# Patient Record
Sex: Female | Born: 1935 | Race: White | Hispanic: No | Marital: Married | Smoking: Former smoker
Health system: Southern US, Community
[De-identification: ages and names within clinical notes are randomized; demographics above are authoritative.]

## PROBLEM LIST (undated history)

## (undated) DIAGNOSIS — E119 Type 2 diabetes mellitus without complications: Secondary | ICD-10-CM

## (undated) DIAGNOSIS — E785 Hyperlipidemia, unspecified: Secondary | ICD-10-CM

## (undated) DIAGNOSIS — D649 Anemia, unspecified: Secondary | ICD-10-CM

## (undated) DIAGNOSIS — K5792 Diverticulitis of intestine, part unspecified, without perforation or abscess without bleeding: Secondary | ICD-10-CM

## (undated) HISTORY — PX: APPENDECTOMY: SHX54

## (undated) HISTORY — DX: Anemia, unspecified: D64.9

## (undated) HISTORY — DX: Hyperlipidemia, unspecified: E78.5

## (undated) HISTORY — DX: Diverticulitis of intestine, part unspecified, without perforation or abscess without bleeding: K57.92

## (undated) HISTORY — DX: Type 2 diabetes mellitus without complications: E11.9

## (undated) HISTORY — PX: BREAST EXCISIONAL BIOPSY: SUR124

---

## 1972-05-16 HISTORY — PX: TUBAL LIGATION: SHX77

## 2010-05-16 HISTORY — PX: BREAST SURGERY: SHX581

## 2010-05-16 HISTORY — PX: MEDIAL PARTIAL KNEE REPLACEMENT: SHX5965

## 2010-05-16 HISTORY — PX: ESOPHAGOGASTRODUODENOSCOPY: SHX1529

## 2011-11-09 ENCOUNTER — Emergency Department: Payer: Self-pay

## 2011-11-09 LAB — URINALYSIS, COMPLETE
Bacteria: NONE SEEN
Bilirubin,UR: NEGATIVE
Blood: NEGATIVE
Ketone: NEGATIVE
Ph: 7 (ref 4.5–8.0)
Protein: NEGATIVE
Specific Gravity: 1.016 (ref 1.003–1.030)
Squamous Epithelial: <1
WBC UR: 1 /HPF (ref 0–5)

## 2011-11-09 LAB — COMPREHENSIVE METABOLIC PANEL
Albumin: 3.9 g/dL (ref 3.4–5.0)
Anion Gap: 9 (ref 7–16)
BUN: 14 mg/dL (ref 7–18)
Bilirubin,Total: 0.4 mg/dL (ref 0.2–1.0)
Chloride: 107 mmol/L (ref 98–107)
Co2: 24 mmol/L (ref 21–32)
Creatinine: 0.93 mg/dL (ref 0.60–1.30)
EGFR (African American): >60
Osmolality: 281 (ref 275–301)
Potassium: 4.2 mmol/L (ref 3.5–5.1)
SGOT(AST): 23 U/L (ref 15–37)
SGPT (ALT): 23 U/L
Sodium: 140 mmol/L (ref 136–145)
Total Protein: 7.5 g/dL (ref 6.4–8.2)

## 2011-11-09 LAB — CBC
HCT: 40.5 % (ref 35.0–47.0)
MCHC: 32.9 g/dL (ref 32.0–36.0)
Platelet: 335 10*3/uL (ref 150–440)
RDW: 14.5 % (ref 11.5–14.5)

## 2012-02-02 ENCOUNTER — Ambulatory Visit: Payer: Self-pay | Admitting: Unknown Physician Specialty

## 2012-11-08 ENCOUNTER — Ambulatory Visit: Payer: Self-pay | Admitting: Gastroenterology

## 2012-12-03 ENCOUNTER — Emergency Department: Payer: Self-pay | Admitting: Emergency Medicine

## 2012-12-03 LAB — CBC
MCH: 29 pg (ref 26.0–34.0)
MCV: 86 fL (ref 80–100)
RBC: 4.55 10*6/uL (ref 3.80–5.20)
RDW: 14.6 % — ABNORMAL HIGH (ref 11.5–14.5)

## 2012-12-03 LAB — BASIC METABOLIC PANEL
BUN: 13 mg/dL (ref 7–18)
Chloride: 106 mmol/L (ref 98–107)
Co2: 29 mmol/L (ref 21–32)
Creatinine: 0.79 mg/dL (ref 0.60–1.30)
EGFR (African American): >60
Glucose: 125 mg/dL — ABNORMAL HIGH (ref 65–99)
Osmolality: 275 (ref 275–301)
Potassium: 4.5 mmol/L (ref 3.5–5.1)
Sodium: 137 mmol/L (ref 136–145)

## 2013-12-17 ENCOUNTER — Ambulatory Visit: Payer: Self-pay | Admitting: Family Medicine

## 2014-02-11 LAB — CBC AND DIFFERENTIAL
HCT: 38 % (ref 36–46)
Hemoglobin: 6.1 g/dL — AB (ref 12.0–16.0)
NEUTROS ABS: 4 /uL
Platelets: 395 10*3/uL (ref 150–399)
WBC: 7.3 10^3/mL

## 2014-02-11 LAB — BASIC METABOLIC PANEL
Glucose: 154 mg/dL
Sodium: 141 mmol/L (ref 137–147)

## 2014-02-11 LAB — LIPID PANEL
Cholesterol: 174 mg/dL (ref 0–200)
HDL: 57 mg/dL (ref 35–70)
LDL Cholesterol: 78 mg/dL
TRIGLYCERIDES: 195 mg/dL — AB (ref 40–160)

## 2014-11-12 ENCOUNTER — Encounter: Payer: Self-pay | Admitting: Family Medicine

## 2014-11-12 ENCOUNTER — Ambulatory Visit (INDEPENDENT_AMBULATORY_CARE_PROVIDER_SITE_OTHER): Payer: Medicare Other | Admitting: Family Medicine

## 2014-11-12 VITALS — BP 112/71 | HR 64 | Temp 98.0°F | Resp 16 | Ht 62.0 in | Wt 152.4 lb

## 2014-11-12 DIAGNOSIS — D649 Anemia, unspecified: Secondary | ICD-10-CM | POA: Insufficient documentation

## 2014-11-12 DIAGNOSIS — E785 Hyperlipidemia, unspecified: Secondary | ICD-10-CM | POA: Insufficient documentation

## 2014-11-12 DIAGNOSIS — R928 Other abnormal and inconclusive findings on diagnostic imaging of breast: Secondary | ICD-10-CM

## 2014-11-12 DIAGNOSIS — D509 Iron deficiency anemia, unspecified: Secondary | ICD-10-CM | POA: Diagnosis not present

## 2014-11-12 DIAGNOSIS — E119 Type 2 diabetes mellitus without complications: Secondary | ICD-10-CM | POA: Diagnosis not present

## 2014-11-12 DIAGNOSIS — K5792 Diverticulitis of intestine, part unspecified, without perforation or abscess without bleeding: Secondary | ICD-10-CM | POA: Insufficient documentation

## 2014-11-12 NOTE — Patient Instructions (Signed)
Health Maintenance  Topic Date Due  . FOOT EXAM  01/12/2015 (Originally 10/23/2014)  . HEMOGLOBIN A1C  01/12/2015 (Originally 07/24/2014)  . URINE MICROALBUMIN  01/12/2015 (Originally 06/10/1941)  . OPHTHALMOLOGY EXAM  05/14/2015 (Originally 07/15/2014)  . INFLUENZA VACCINE  12/15/2014  . COLONOSCOPY  10/15/2022  . TETANUS/TDAP  12/15/2023  . DEXA SCAN  Completed  . ZOSTAVAX  Completed  . PNA vac Low Risk Adult  Completed

## 2014-11-12 NOTE — Progress Notes (Signed)
Patient: Ashley Kramer, Female    DOB: 06/06/1931, 81 y.o.   MRN: [id number] Visit Date: 11/12/2014  Today's Provider: Fidel LevyJames Hawkins Jr, MD  Chief Complaint  Patient presents with  . Medicare Wellness    Subjective:   Initial preventative physical exam Ashley Kramer is a 81 y.o. female who presents today for her Initial Preventative Physical Exam. She feels well. She reports exercising . She reports she is sleeping well.  HPI  Review of Systems  History   Social History  . Marital Status: Married    Spouse Name: N/A  . Number of Children: N/A  . Years of Education: N/A   Occupational History  . Not on file.   Social History Main Topics  . Smoking status: Former Smoker    Types: Cigarettes    Quit date: 11/11/1997  . Smokeless tobacco: Never Used  . Alcohol Use: No  . Drug Use: No  . Sexual Activity: Not on file   Other Topics Concern  . Not on file   Social History Narrative  . No narrative on file    Patient Active Problem List   Diagnosis Date Noted  . Diabetes 11/12/2014  . Hyperlipemia 11/12/2014  . Abnormal mammogram 11/12/2014  . Anemia 11/12/2014  . Diverticulitis 11/12/2014    Past Surgical History  Procedure Laterality Date  . Esophagogastroduodenoscopy  2012  . Appendectomy    . Breast surgery  2012    BIOPSY   . Tubal ligation  1974  . Medial partial knee replacement Right 2012    Her family history includes Cancer in her mother; Diabetes in her father; Stroke in her mother.    Previous Medications   ATORVASTATIN (LIPITOR) 40 MG TABLET    Take 40 mg by mouth daily.   CARVEDILOL (COREG) 6.25 MG TABLET       CHOLECALCIFEROL (VITAMIN D-3 PO)    Take by mouth.   CLOPIDOGREL (PLAVIX) 75 MG TABLET    Take 75 mg by mouth daily.   METFORMIN (GLUCOPHAGE) 500 MG TABLET    Take 500 mg by mouth 2 (two) times daily with a meal.   MULTIPLE VITAMIN (MULTIVITAMIN) CAPSULE    Take 1 capsule by mouth daily.   OMEGA-3 FATTY ACIDS (FISH OIL)  1000 MG CPDR    Take by mouth.   SERTRALINE (ZOLOFT) 50 MG TABLET    Take 50 mg by mouth daily.    Patient Care Team: Janeann ForehandJames H Hawkins Jr., MD as PCP - General (Family Medicine)     Objective:   Vitals: BP 112/71 mmHg  Pulse 64  Temp(Src) 98 F (36.7 C)  Resp 16  Ht 5\' 2"  (1.575 m)  Wt 152 lb 6.4 oz (69.128 kg)  BMI 27.87 kg/m2  Physical Exam    Hearing Screening   Method: Otoacoustic emissions   125Hz  250Hz  500Hz  1000Hz  2000Hz  4000Hz  8000Hz   Right ear:         Left ear:         Comments: Finger Rub Pass R and L ears.    Visual Acuity Screening   Right eye Left eye Both eyes  Without correction: 20/13 20/50 20/15   With correction:       Activities of Daily Living In your present state of health, do you have any difficulty performing the following activities: 11/12/2014  Hearing? N  Vision? N  Difficulty concentrating or making decisions? N  Walking or climbing stairs? N  Dressing or bathing? N  Doing errands,  shopping? N  Preparing Food and eating ? N  Using the Toilet? N  In the past six months, have you accidently leaked urine? N  Do you have problems with loss of bowel control? N  Managing your Medications? N  Managing your Finances? N  Housekeeping or managing your Housekeeping? N    Fall Risk Assessment Fall Risk  11/12/2014 11/12/2014  Falls in the past year? Yes No  Number falls in past yr: 1 -  Injury with Fall? No -     Patient reports there are not safety devices in place in shower at home.   Depression Screen PHQ 2/9 Scores 11/12/2014 11/12/2014  PHQ - 2 Score 0 0    MMSE MMSE - Mini Mental State Exam 11/12/2014  Orientation to time 5  Orientation to Place 5  Registration 3  Attention/ Calculation 5  Recall 3  Language- name 2 objects 2  Language- repeat 1  Language- follow 3 step command 3  Language- read & follow direction 1  Write a sentence 1  Copy design 1  Total score 30      Assessment & Plan:     Initial Preventative  Physical Exam  Reviewed patient's Family Medical History Reviewed and updated list of patient's medical providers Assessment of cognitive impairment was done Assessed patient's functional ability Established a written schedule for health screening services Health Risk Assessent Completed and Reviewed  Exercise Activities and Dietary recommendations Goals    . Eat more fruits and vegetables    . Exercise 3x per week (30 min per time)     Increase excersizing       Immunization History  Administered Date(s) Administered  . Influenza-Unspecified 02/13/2014  . Pneumococcal Conjugate-13 02/13/2014  . Pneumococcal Polysaccharide-23 05/16/2012  . Tdap 10/22/2013    Health Maintenance  Topic Date Due  . FOOT EXAM  01/12/2015 (Originally 10/23/2014)  . HEMOGLOBIN A1C  01/12/2015 (Originally 07/24/2014)  . URINE MICROALBUMIN  01/12/2015 (Originally 06/10/1941)  . OPHTHALMOLOGY EXAM  05/14/2015 (Originally 07/15/2014)  . INFLUENZA VACCINE  12/15/2014  . COLONOSCOPY  10/15/2022  . TETANUS/TDAP  12/15/2023  . DEXA SCAN  Completed  . ZOSTAVAX  Completed  . PNA vac Low Risk Adult  Completed      Discussed health benefits of physical activity, and encouraged her to engage in regular exercise appropriate for her age and condition.    ------------------------------------------------------------------------------------------------------------   Problem List Items Addressed This Visit      Endocrine   Diabetes - Primary   Relevant Medications   atorvastatin (LIPITOR) 40 MG tablet   metFORMIN (GLUCOPHAGE) 500 MG tablet     Other   Hyperlipemia   Relevant Medications   atorvastatin (LIPITOR) 40 MG tablet   carvedilol (COREG) 6.25 MG tablet   Abnormal mammogram   Anemia       Venora Maples, MD [facility]  11/12/2014

## 2014-12-01 ENCOUNTER — Telehealth: Payer: Self-pay | Admitting: Family Medicine

## 2014-12-01 NOTE — Telephone Encounter (Signed)
[facility] [address]

## 2014-12-02 ENCOUNTER — Other Ambulatory Visit: Payer: Self-pay

## 2014-12-02 ENCOUNTER — Other Ambulatory Visit: Payer: Self-pay | Admitting: Family Medicine

## 2014-12-02 DIAGNOSIS — T7840XA Allergy, unspecified, initial encounter: Secondary | ICD-10-CM

## 2014-12-02 MED ORDER — EPINEPHRINE 0.3 MG/0.3ML IJ SOAJ: 0.3000 mg | Freq: Once | INTRAMUSCULAR | Status: DC | PRN

## 2014-12-02 NOTE — Telephone Encounter (Signed)
It was send to her pharmacy.

## 2014-12-03 ENCOUNTER — Encounter: Payer: Self-pay | Admitting: Family Medicine

## 2014-12-03 ENCOUNTER — Ambulatory Visit (INDEPENDENT_AMBULATORY_CARE_PROVIDER_SITE_OTHER): Payer: Medicare Other | Admitting: Family Medicine

## 2014-12-03 VITALS — BP 114/71 | HR 64 | Resp 16 | Ht 62.0 in | Wt 152.4 lb

## 2014-12-03 DIAGNOSIS — I10 Essential (primary) hypertension: Secondary | ICD-10-CM

## 2014-12-03 DIAGNOSIS — F419 Anxiety disorder, unspecified: Secondary | ICD-10-CM | POA: Diagnosis not present

## 2014-12-03 DIAGNOSIS — E119 Type 2 diabetes mellitus without complications: Secondary | ICD-10-CM | POA: Diagnosis not present

## 2014-12-03 DIAGNOSIS — G459 Transient cerebral ischemic attack, unspecified: Secondary | ICD-10-CM | POA: Insufficient documentation

## 2014-12-03 DIAGNOSIS — E785 Hyperlipidemia, unspecified: Secondary | ICD-10-CM

## 2014-12-03 LAB — POCT GLYCOSYLATED HEMOGLOBIN (HGB A1C): Hemoglobin A1C: 7.4

## 2014-12-03 MED ORDER — ATORVASTATIN CALCIUM 40 MG PO TABS: 40.0000 mg | ORAL_TABLET | Freq: Every day | ORAL | Status: DC

## 2014-12-03 MED ORDER — CARVEDILOL 6.25 MG PO TABS: 6.2500 mg | ORAL_TABLET | Freq: Two times a day (BID) | ORAL | Status: DC

## 2014-12-03 MED ORDER — METFORMIN HCL 500 MG PO TABS: 500.0000 mg | ORAL_TABLET | Freq: Two times a day (BID) | ORAL | Status: DC

## 2014-12-03 MED ORDER — SERTRALINE HCL 50 MG PO TABS: 50.0000 mg | ORAL_TABLET | Freq: Every day | ORAL | Status: DC

## 2014-12-03 MED ORDER — CLOPIDOGREL BISULFATE 75 MG PO TABS: 75.0000 mg | ORAL_TABLET | Freq: Every day | ORAL | Status: DC

## 2014-12-03 NOTE — Progress Notes (Signed)
Name: Ashley Kramer   MRN: [id number]    DOB: 10-11-1931   Date:12/03/2014       Progress Note  Subjective  Chief Complaint  Chief Complaint  Patient presents with  . Diabetes    99-130 BS monitoring three times a week.     HPI  Here for f/u of DM and HBP.  Also with elevated lipids.  Had TIA in past. She is taking meds and feels well.   Past Medical History  Diagnosis Date  . Diverticulitis   . Hyperlipidemia   . Diabetes mellitus without complication   . Anemia     Past Surgical History  Procedure Laterality Date  . Esophagogastroduodenoscopy  2012  . Appendectomy    . Breast surgery  2012    BIOPSY   . Tubal ligation  1974  . Medial partial knee replacement Right 2012    Family History  Problem Relation Age of Onset  . Stroke Mother   . Cancer Mother   . Diabetes Father     History   Social History  . Marital Status: Married    Spouse Name: N/A  . Number of Children: N/A  . Years of Education: N/A   Occupational History  . Not on file.   Social History Main Topics  . Smoking status: Former Smoker    Types: Cigarettes    Quit date: 11/11/1997  . Smokeless tobacco: Never Used  . Alcohol Use: No  . Drug Use: No  . Sexual Activity: Not on file   Other Topics Concern  . Not on file   Social History Narrative     Current outpatient prescriptions:  .  atorvastatin (LIPITOR) 40 MG tablet, Take 40 mg by mouth daily., Disp: , Rfl: 0 .  carvedilol (COREG) 6.25 MG tablet, , Disp: , Rfl: 0 .  Cholecalciferol (VITAMIN D-3 PO), Take by mouth., Disp: , Rfl:  .  clopidogrel (PLAVIX) 75 MG tablet, Take 75 mg by mouth daily., Disp: , Rfl: 0 .  EPINEPHrine 0.3 mg/0.3 mL IJ SOAJ injection, Inject 0.3 mg into the muscle once. EPI PEN INJ, Disp: , Rfl:  .  EPINEPHrine 0.3 mg/0.3 mL IJ SOAJ injection, Inject 0.3 mLs (0.3 mg total) into the muscle Once PRN (Allergic Reaction USE ONLY)., Disp: 1 Device, Rfl: 1 .  metFORMIN (GLUCOPHAGE) 500 MG tablet, Take 500  mg by mouth 2 (two) times daily with a meal., Disp: , Rfl:  .  Multiple Vitamin (MULTIVITAMIN) capsule, Take 1 capsule by mouth daily., Disp: , Rfl:  .  Omega-3 Fatty Acids (FISH OIL) 1000 MG CPDR, Take by mouth., Disp: , Rfl:  .  sertraline (ZOLOFT) 50 MG tablet, Take 50 mg by mouth daily., Disp: , Rfl: 0  No Known Allergies   Review of Systems  Constitutional: Negative for fever, chills and malaise/fatigue.  HENT: Negative for congestion and nosebleeds.   Eyes: Negative for blurred vision and double vision.  Respiratory: Negative for cough, sputum production, shortness of breath and wheezing.   Cardiovascular: Negative for chest pain, palpitations, orthopnea, claudication and leg swelling.  Gastrointestinal: Negative for heartburn and blood in stool.  Genitourinary: Negative for dysuria, urgency and frequency.  Musculoskeletal: Negative for myalgias and joint pain.  Skin: Negative for rash.  Neurological: Negative for dizziness, tremors, sensory change, focal weakness, weakness and headaches.  Psychiatric/Behavioral: Negative for depression. The patient is nervous/anxious.      Objective  Filed Vitals:   12/03/14 1133  BP: 114/71  Pulse:  64  Resp: 16  Height: 5\' 2"  (1.575 m)  Weight: 152 lb 6.4 oz (69.128 kg)    Physical Exam  Constitutional: She is well-developed, well-nourished, and in no distress. No distress.  HENT:  Head: Normocephalic and atraumatic.  Eyes: Conjunctivae and EOM are normal. Pupils are equal, round, and reactive to light. No scleral icterus.  Neck: Normal range of motion. Neck supple. No thyromegaly present.  Cardiovascular: Normal rate, regular rhythm, normal heart sounds and intact distal pulses.  Exam reveals no gallop and no friction rub.   No murmur heard. Pulmonary/Chest: Effort normal and breath sounds normal. No respiratory distress. She has no wheezes. She has no rales.  Abdominal: Soft. Bowel sounds are normal. She exhibits no distension and  no mass. There is no tenderness.  Musculoskeletal: She exhibits no edema.  Lymphadenopathy:    She has no cervical adenopathy.  Psychiatric: Mood, memory, affect and judgment normal.  Vitals reviewed.      No results found for this or any previous visit (from the past 2160 hour(s)).   Assessment & Plan  Problem List Items Addressed This Visit      Cardiovascular and Mediastinum   Hypertension   TIA (transient ischemic attack)    Other Visit Diagnoses    Type 2 diabetes mellitus without complication    -  Primary    Relevant Orders    POCT HgB A1C      1. Type 2 diabetes mellitus without complication  - POCT HgB A1C - Comprehensive Metabolic Panel (CMET) - metFORMIN (GLUCOPHAGE) 500 MG tablet; Take 1 tablet (500 mg total) by mouth 2 (two) times daily with a meal.  Dispense: 180 tablet; Refill: 3  2. Essential hypertension  - carvedilol (COREG) 6.25 MG tablet; Take 1 tablet (6.25 mg total) by mouth 2 (two) times daily with a meal.  Dispense: 180 tablet; Refill: 3  3. Transient cerebral ischemia, unspecified transient cerebral ischemia type  - CBC with Differential - clopidogrel (PLAVIX) 75 MG tablet; Take 1 tablet (75 mg total) by mouth daily.  Dispense: 90 tablet; Refill: 3  4. Hyperlipemia  - Lipid Profile - atorvastatin (LIPITOR) 40 MG tablet; Take 1 tablet (40 mg total) by mouth daily.  Dispense: 90 tablet; Refill: 3  5.  - sertraline (ZOLOFT) 50 MG tablet; Take 1 tablet (50 mg total) by mouth daily.  Dispense: 90 tablet; Refill: 3

## 2014-12-04 LAB — CBC WITH DIFFERENTIAL/PLATELET
BASOS ABS: 0.1 10*3/uL (ref 0.0–0.2)
BASOS: 1 %
EOS (ABSOLUTE): 0.3 10*3/uL (ref 0.0–0.4)
Eos: 5 %
Hematocrit: 41.4 % (ref 34.0–46.6)
Hemoglobin: 13.5 g/dL (ref 11.1–15.9)
IMMATURE GRANS (ABS): 0 10*3/uL (ref 0.0–0.1)
IMMATURE GRANULOCYTES: 0 %
LYMPHS: 38 %
Lymphocytes Absolute: 2.7 10*3/uL (ref 0.7–3.1)
MCH: 28.9 pg (ref 26.6–33.0)
MCHC: 32.6 g/dL (ref 31.5–35.7)
MCV: 89 fL (ref 79–97)
Monocytes Absolute: 0.7 10*3/uL (ref 0.1–0.9)
Monocytes: 10 %
Neutrophils Absolute: 3.3 10*3/uL (ref 1.4–7.0)
Neutrophils: 46 %
Platelets: 351 10*3/uL (ref 150–379)
RBC: 4.67 x10E6/uL (ref 3.77–5.28)
RDW: 14.7 % (ref 12.3–15.4)
WBC: 7.1 10*3/uL (ref 3.4–10.8)

## 2014-12-04 LAB — LIPID PANEL
CHOLESTEROL TOTAL: 183 mg/dL (ref 100–199)
Chol/HDL Ratio: 3 ratio units (ref 0.0–4.4)
HDL: 62 mg/dL (ref >39)
LDL CALC: 92 mg/dL (ref 0–99)
Triglycerides: 144 mg/dL (ref 0–149)
VLDL CHOLESTEROL CAL: 29 mg/dL (ref 5–40)

## 2014-12-04 LAB — COMPREHENSIVE METABOLIC PANEL
ALK PHOS: 79 IU/L (ref 39–117)
ALT: 17 IU/L (ref 0–32)
AST: 18 IU/L (ref 0–40)
Albumin/Globulin Ratio: 2.2 (ref 1.1–2.5)
Albumin: 4.6 g/dL (ref 3.5–4.7)
BUN / CREAT RATIO: 17 (ref 11–26)
BUN: 12 mg/dL (ref 8–27)
Bilirubin Total: 0.4 mg/dL (ref 0.0–1.2)
CALCIUM: 10.1 mg/dL (ref 8.7–10.3)
CO2: 26 mmol/L (ref 18–29)
Chloride: 102 mmol/L (ref 97–108)
Creatinine, Ser: 0.72 mg/dL (ref 0.57–1.00)
GFR calc non Af Amer: 79 mL/min/{1.73_m2} (ref >59)
GFR, EST AFRICAN AMERICAN: 91 mL/min/{1.73_m2} (ref >59)
GLUCOSE: 115 mg/dL — AB (ref 65–99)
Globulin, Total: 2.1 g/dL (ref 1.5–4.5)
POTASSIUM: 5 mmol/L (ref 3.5–5.2)
Sodium: 142 mmol/L (ref 134–144)
Total Protein: 6.7 g/dL (ref 6.0–8.5)

## 2014-12-08 NOTE — Progress Notes (Signed)
Mailed.JH  

## 2015-01-15 ENCOUNTER — Telehealth: Payer: Self-pay | Admitting: *Deleted

## 2015-01-15 NOTE — Telephone Encounter (Signed)
Good-jh 

## 2015-01-15 NOTE — Telephone Encounter (Signed)
Called patient to find out if she has been keep blood sugar logs. Per [facility] this is one of the requirements. Pt has logs and will bring them with her to next appt on 01/20/15.

## 2015-01-20 ENCOUNTER — Ambulatory Visit (INDEPENDENT_AMBULATORY_CARE_PROVIDER_SITE_OTHER): Payer: Medicare Other | Admitting: Family Medicine

## 2015-01-20 ENCOUNTER — Encounter: Payer: Self-pay | Admitting: Family Medicine

## 2015-01-20 VITALS — BP 130/77 | HR 76 | Resp 16 | Ht 62.0 in | Wt 153.6 lb

## 2015-01-20 DIAGNOSIS — I10 Essential (primary) hypertension: Secondary | ICD-10-CM

## 2015-01-20 DIAGNOSIS — E785 Hyperlipidemia, unspecified: Secondary | ICD-10-CM

## 2015-01-20 DIAGNOSIS — Z23 Encounter for immunization: Secondary | ICD-10-CM | POA: Diagnosis not present

## 2015-01-20 DIAGNOSIS — E119 Type 2 diabetes mellitus without complications: Secondary | ICD-10-CM | POA: Diagnosis not present

## 2015-01-20 NOTE — Patient Instructions (Signed)
Continue current meds.  To get an A1c in [place] in about 6 weeks (after Oct. 22, 2016).  She will call us for an  Order to be faxed therr closer to time.

## 2015-01-20 NOTE — Progress Notes (Signed)
Name: Ashley Kramer   MRN: [id number]    DOB: 1932-01-21   Date:01/20/2015       Progress Note  Subjective  Chief Complaint  Chief Complaint  Patient presents with  . Diabetes    7.4 last A1C 12/05/2014.     HPI  Here for f/u of DM.  BSs fluctuating some.  (75-200 2ith most <170).  Taking higher dose of Metformin (2000mg /d) now.    No problem-specific assessment & plan notes found for this encounter.   Past Medical History  Diagnosis Date  . Diverticulitis   . Hyperlipidemia   . Diabetes mellitus without complication   . Anemia     Social History  Substance Use Topics  . Smoking status: Former Smoker    Types: Cigarettes    Quit date: 11/11/1997  . Smokeless tobacco: Never Used  . Alcohol Use: No     Current outpatient prescriptions:  .  atorvastatin (LIPITOR) 40 MG tablet, Take 1 tablet (40 mg total) by mouth daily., Disp: 90 tablet, Rfl: 3 .  carvedilol (COREG) 6.25 MG tablet, Take 1 tablet (6.25 mg total) by mouth 2 (two) times daily with a meal., Disp: 180 tablet, Rfl: 3 .  Cholecalciferol (VITAMIN D-3 PO), Take by mouth., Disp: , Rfl:  .  clopidogrel (PLAVIX) 75 MG tablet, Take 1 tablet (75 mg total) by mouth daily., Disp: 90 tablet, Rfl: 3 .  EPINEPHrine 0.3 mg/0.3 mL IJ SOAJ injection, Inject 0.3 mg into the muscle once. EPI PEN INJ, Disp: , Rfl:  .  metFORMIN (GLUCOPHAGE) 500 MG tablet, Take 1 tablet (500 mg total) by mouth 2 (two) times daily with a meal. (Patient taking differently: Take 1,000 mg by mouth 2 (two) times daily with a meal. ), Disp: 180 tablet, Rfl: 3 .  Multiple Vitamin (MULTIVITAMIN) capsule, Take 1 capsule by mouth daily., Disp: , Rfl:  .  Omega-3 Fatty Acids (FISH OIL) 1000 MG CPDR, Take by mouth., Disp: , Rfl:  .  sertraline (ZOLOFT) 50 MG tablet, Take 1 tablet (50 mg total) by mouth daily., Disp: 90 tablet, Rfl: 3  No Known Allergies  Review of Systems  Constitutional: Negative for fever and chills.  HENT: Negative for hearing loss.    Eyes: Negative for blurred vision and double vision.  Respiratory: Negative for cough, sputum production, shortness of breath and wheezing.   Cardiovascular: Negative for chest pain, palpitations and leg swelling.  Gastrointestinal: Negative for heartburn, nausea, vomiting, abdominal pain, diarrhea and blood in stool.  Genitourinary: Negative for dysuria, urgency and frequency.  Musculoskeletal: Negative for myalgias and joint pain.  Skin: Negative for rash.  Neurological: Negative for dizziness, sensory change, focal weakness and headaches.      Objective  Filed Vitals:   01/20/15 1640  BP: 130/77  Pulse: 76  Resp: 16  Height: 5\' 2"  (1.575 m)  Weight: 153 lb 9.6 oz (69.673 kg)     Physical Exam  Constitutional: She is oriented to person, place, and time and well-developed, well-nourished, and in no distress. No distress.  HENT:  Head: Normocephalic and atraumatic.  Eyes: Conjunctivae are normal. Pupils are equal, round, and reactive to light. No scleral icterus.  Neck: Normal range of motion. Neck supple. Carotid bruit is not present. No thyromegaly present.  Cardiovascular: Normal rate, regular rhythm, normal heart sounds and intact distal pulses.  Exam reveals no gallop and no friction rub.   No murmur heard. Pulmonary/Chest: Effort normal and breath sounds normal. No respiratory distress. She  has no wheezes. She has no rales.  Abdominal: Soft. Bowel sounds are normal. She exhibits no distension and no mass. There is no tenderness.  Musculoskeletal: She exhibits no edema.  Lymphadenopathy:    She has no cervical adenopathy.  Neurological: She is alert and oriented to person, place, and time.  Vitals reviewed.     Recent Results (from the past 2160 hour(s))  POCT HgB A1C     Status: Abnormal   Collection Time: 12/03/14 12:06 PM  Result Value Ref Range   Hemoglobin A1C 7.4   Comprehensive Metabolic Panel (CMET)     Status: Abnormal   Collection Time: 12/03/14   1:56 PM  Result Value Ref Range   Glucose 115 (H) 65 - 99 mg/dL   BUN 12 8 - 27 mg/dL   Creatinine, Ser 1.61 0.57 - 1.00 mg/dL   GFR calc non Af Amer 79 >59 mL/min/1.73   GFR calc Af Amer 91 >59 mL/min/1.73   BUN/Creatinine Ratio 17 11 - 26   Sodium 142 134 - 144 mmol/L   Potassium 5.0 3.5 - 5.2 mmol/L   Chloride 102 97 - 108 mmol/L   CO2 26 18 - 29 mmol/L   Calcium 10.1 8.7 - 10.3 mg/dL   Total Protein 6.7 6.0 - 8.5 g/dL   Albumin 4.6 3.5 - 4.7 g/dL   Globulin, Total 2.1 1.5 - 4.5 g/dL   Albumin/Globulin Ratio 2.2 1.1 - 2.5   Bilirubin Total 0.4 0.0 - 1.2 mg/dL   Alkaline Phosphatase 79 39 - 117 IU/L   AST 18 0 - 40 IU/L   ALT 17 0 - 32 IU/L  CBC with Differential     Status: None   Collection Time: 12/03/14  1:56 PM  Result Value Ref Range   WBC 7.1 3.4 - 10.8 x10E3/uL   RBC 4.67 3.77 - 5.28 x10E6/uL   Hemoglobin 13.5 11.1 - 15.9 g/dL   Hematocrit 09.6 04.5 - 46.6 %   MCV 89 79 - 97 fL   MCH 28.9 26.6 - 33.0 pg   MCHC 32.6 31.5 - 35.7 g/dL   RDW 40.9 81.1 - 91.4 %   Platelets 351 150 - 379 x10E3/uL   Neutrophils 46 %   Lymphs 38 %   Monocytes 10 %   Eos 5 %   Basos 1 %   Neutrophils Absolute 3.3 1.4 - 7.0 x10E3/uL   Lymphocytes Absolute 2.7 0.7 - 3.1 x10E3/uL   Monocytes Absolute 0.7 0.1 - 0.9 x10E3/uL   EOS (ABSOLUTE) 0.3 0.0 - 0.4 x10E3/uL   Basophils Absolute 0.1 0.0 - 0.2 x10E3/uL   Immature Granulocytes 0 %   Immature Grans (Abs) 0.0 0.0 - 0.1 x10E3/uL  Lipid Profile     Status: None   Collection Time: 12/03/14  1:56 PM  Result Value Ref Range   Cholesterol, Total 183 100 - 199 mg/dL   Triglycerides 782 0 - 149 mg/dL   HDL 62 >95 mg/dL    Comment: According to ATP-III Guidelines, HDL-C >59 mg/dL is considered a negative risk factor for CHD.    VLDL Cholesterol Cal 29 5 - 40 mg/dL   LDL Calculated 92 0 - 99 mg/dL   Chol/HDL Ratio 3.0 0.0 - 4.4 ratio units    Comment:                                   T. Chol/HDL Ratio  Men  Women                               1/2 Avg.Risk  3.4    3.3                                   Avg.Risk  5.0    4.4                                2X Avg.Risk  9.6    7.1                                3X Avg.Risk 23.4   11.0      Assessment & Plan  1. Type 2 diabetes mellitus without complication   2. Essential hypertension   3. Need for influenza vaccination  - Flu vaccine HIGH DOSE PF (Fluzone High dose)  4. Hyperlipemia

## 2015-01-26 ENCOUNTER — Ambulatory Visit: Payer: Medicare Other | Admitting: Family Medicine

## 2015-01-31 ENCOUNTER — Other Ambulatory Visit: Payer: Self-pay | Admitting: Family Medicine

## 2015-03-05 ENCOUNTER — Telehealth: Payer: Self-pay | Admitting: Family Medicine

## 2015-03-05 DIAGNOSIS — Z1239 Encounter for other screening for malignant neoplasm of breast: Secondary | ICD-10-CM

## 2015-03-05 DIAGNOSIS — E119 Type 2 diabetes mellitus without complications: Secondary | ICD-10-CM

## 2015-03-05 NOTE — Telephone Encounter (Signed)
Pt called requesting we  Order a Mammo,   A1C and [place] [facility]  [address] [facility]  [place]  [phone].  [place]. Pt call back # is  [phone]

## 2015-03-05 NOTE — Telephone Encounter (Signed)
Done

## 2015-03-05 NOTE — Telephone Encounter (Signed)
OK to order these for her in [place].-jh

## 2015-03-24 ENCOUNTER — Telehealth: Payer: Self-pay | Admitting: *Deleted

## 2015-03-24 LAB — HM MAMMOGRAPHY

## 2015-03-24 NOTE — Telephone Encounter (Signed)
Called patient to find out how many times per day she checks her blood sugar. Received a 3 rd request for information from [facility] for ov notes from 07/22/2014. Patient has notes for 02/17/14 ; 11/12/14 and 01/2015. She has enough strips to last several months, she is in [place] until 08/2015.  We don't have documentation [facility] is requested. They want documented notes times blood sugar checked daily. This has not been documented. Patient is having A1C and mammogram today. She will call us for a printed rx for dm supplies [place]. [facility] will get them [facility] [employer].  Meanwhile need to check with insurance to find out if patient can get thru local pharmacy of is she has to use [facility].

## 2015-03-31 ENCOUNTER — Encounter: Payer: Self-pay | Admitting: Family Medicine

## 2015-09-21 ENCOUNTER — Ambulatory Visit: Payer: Medicare Other | Admitting: Family Medicine

## 2015-09-24 ENCOUNTER — Ambulatory Visit (INDEPENDENT_AMBULATORY_CARE_PROVIDER_SITE_OTHER): Payer: Medicare Other | Admitting: Family Medicine

## 2015-09-24 ENCOUNTER — Encounter: Payer: Self-pay | Admitting: Family Medicine

## 2015-09-24 ENCOUNTER — Other Ambulatory Visit: Payer: Self-pay | Admitting: *Deleted

## 2015-09-24 VITALS — BP 130/70 | HR 70 | Temp 97.9°F | Resp 16 | Ht 62.0 in | Wt 150.0 lb

## 2015-09-24 DIAGNOSIS — I10 Essential (primary) hypertension: Secondary | ICD-10-CM | POA: Diagnosis not present

## 2015-09-24 DIAGNOSIS — F419 Anxiety disorder, unspecified: Secondary | ICD-10-CM

## 2015-09-24 DIAGNOSIS — E119 Type 2 diabetes mellitus without complications: Secondary | ICD-10-CM

## 2015-09-24 DIAGNOSIS — G459 Transient cerebral ischemic attack, unspecified: Secondary | ICD-10-CM | POA: Diagnosis not present

## 2015-09-24 DIAGNOSIS — E785 Hyperlipidemia, unspecified: Secondary | ICD-10-CM

## 2015-09-24 LAB — POCT GLYCOSYLATED HEMOGLOBIN (HGB A1C): Hemoglobin A1C: 7.6

## 2015-09-24 MED ORDER — SERTRALINE HCL 25 MG PO TABS: 25.0000 mg | ORAL_TABLET | Freq: Every day | ORAL | Status: DC

## 2015-09-24 MED ORDER — SITAGLIPTIN PHOSPHATE 50 MG PO TABS: 50.0000 mg | ORAL_TABLET | Freq: Every day | ORAL | Status: DC

## 2015-09-24 NOTE — Progress Notes (Signed)
Name: Ashley Kramer   MRN: [id number]    DOB: [birth year]   Date:09/24/2015       Progress Note  Subjective  Chief Complaint  Chief Complaint  Patient presents with  . Diabetes    HPI Here for f/u of DM and HBP.  She is taking all her meds.  Shew adoits to not watching her diet well.  overall she feels well.  No problem-specific assessment & plan notes found for this encounter.   Past Medical History  Diagnosis Date  . Diverticulitis   . Hyperlipidemia   . Diabetes mellitus without complication (HCC)   . Anemia     Past Surgical History  Procedure Laterality Date  . Esophagogastroduodenoscopy  2012  . Appendectomy    . Breast surgery  2012    BIOPSY   . Tubal ligation  1974  . Medial partial knee replacement Right 2012    Family History  Problem Relation Age of Onset  . Stroke Mother   . Cancer Mother   . Diabetes Father   . Alzheimer's disease Father     Social History   Social History  . Marital Status: Married    Spouse Name: N/A  . Number of Children: N/A  . Years of Education: N/A   Occupational History  . Not on file.   Social History Main Topics  . Smoking status: Former Smoker    Types: Cigarettes    Quit date: 11/11/1997  . Smokeless tobacco: Never Used  . Alcohol Use: No  . Drug Use: No  . Sexual Activity: Not on file   Other Topics Concern  . Not on file   Social History Narrative     Current outpatient prescriptions:  .  atorvastatin (LIPITOR) 40 MG tablet, Take 1 tablet (40 mg total) by mouth daily., Disp: 90 tablet, Rfl: 3 .  carvedilol (COREG) 6.25 MG tablet, take 1 tablet by mouth twice a day, Disp: 180 tablet, Rfl: 4 .  Cholecalciferol (VITAMIN D-3 PO), Take by mouth., Disp: , Rfl:  .  clopidogrel (PLAVIX) 75 MG tablet, Take 1 tablet (75 mg total) by mouth daily., Disp: 90 tablet, Rfl: 3 .  EPINEPHrine 0.3 mg/0.3 mL IJ SOAJ injection, Inject 0.3 mg into the muscle once. EPI PEN INJ, Disp: , Rfl:  .  metFORMIN (GLUCOPHAGE)  500 MG tablet, Take 1 tablet (500 mg total) by mouth 2 (two) times daily with a meal. (Patient taking differently: Take 1,000 mg by mouth 2 (two) times daily with a meal. ), Disp: 180 tablet, Rfl: 3 .  Multiple Vitamin (MULTIVITAMIN) capsule, Take 1 capsule by mouth daily., Disp: , Rfl:  .  Omega-3 Fatty Acids (FISH OIL) 1000 MG CPDR, Take by mouth., Disp: , Rfl:  .  sertraline (ZOLOFT) 25 MG tablet, Take 1 tablet (25 mg total) by mouth daily., Disp: 90 tablet, Rfl: 3 .  sitaGLIPtin (JANUVIA) 50 MG tablet, Take 1 tablet (50 mg total) by mouth daily., Disp: 30 tablet, Rfl: 6  Not on File   Review of Systems  Constitutional: Positive for malaise/fatigue. Negative for fever, chills and weight loss.  HENT: Negative for hearing loss.   Eyes: Negative for double vision.  Respiratory: Negative for cough, shortness of breath and wheezing.   Cardiovascular: Negative for chest pain, palpitations and leg swelling.  Gastrointestinal: Negative for heartburn, abdominal pain and blood in stool.  Genitourinary: Negative for dysuria, urgency and frequency.  Musculoskeletal: Negative for myalgias and joint pain.  Skin: Negative for  rash.  Neurological: Negative for dizziness, tremors, weakness and headaches.  Psychiatric/Behavioral: Negative for depression (controlled with Zoloft).      Objective  Filed Vitals:   09/24/15 1305 09/24/15 1354  BP: 126/70 130/70  Pulse: 70   Temp: 97.9 F (36.6 C)   TempSrc: Oral   Resp: 16   Height:  (1.575 m)   Weight: 150 lb (68.04 kg)     Physical Exam  Constitutional: She is oriented to person, place, and time and well-developed, well-nourished, and in no distress. No distress.  HENT:  Head: Normocephalic and atraumatic.  Eyes: Conjunctivae and EOM are normal. Pupils are equal, round, and reactive to light. No scleral icterus.  Neck: Normal range of motion. Neck supple. Carotid bruit is not present. No thyromegaly present.  Cardiovascular: Normal  rate, regular rhythm and normal heart sounds.  Exam reveals no gallop and no friction rub.   No murmur heard. Pulmonary/Chest: Effort normal and breath sounds normal. No respiratory distress. She has no wheezes. She has no rales.  Abdominal: Soft. She exhibits no distension, no abdominal bruit and no mass. There is no tenderness.  Musculoskeletal: She exhibits no edema.  Lymphadenopathy:    She has no cervical adenopathy.  Neurological: She is alert and oriented to person, place, and time.  Psychiatric: Mood, memory, affect and judgment normal.  Vitals reviewed.      Recent Results (from the past 2160 hour(s))  POCT HgB A1C     Status: Abnormal   Collection Time: 09/24/15  1:26 PM  Result Value Ref Range   Hemoglobin A1C 7.6      Assessment & Plan  Problem List Items Addressed This Visit      Cardiovascular and Mediastinum   Hypertension   TIA (transient ischemic attack)     Endocrine   Diabetes (HCC) - Primary   Relevant Medications   sitaGLIPtin (JANUVIA) 50 MG tablet   Other Relevant Orders   POCT HgB A1C (Completed)     Other   Hyperlipemia    Other Visit Diagnoses    Acute anxiety        Relevant Medications    sertraline (ZOLOFT) 25 MG tablet       Meds ordered this encounter  Medications  . sertraline (ZOLOFT) 25 MG tablet    Sig: Take 1 tablet (25 mg total) by mouth daily.    Dispense:  90 tablet    Refill:  3  . sitaGLIPtin (JANUVIA) 50 MG tablet    Sig: Take 1 tablet (50 mg total) by mouth daily.    Dispense:  30 tablet    Refill:  6   1. Type 2 diabetes mellitus without complication, without long-term current use of insulin (HCC)   - POCT HgB A1C-7.6 - sitaGLIPtin (JANUVIA) 50 MG tablet; Take 1 tablet (50 mg total) by mouth daily.  Dispense: 30 tablet; Refill: 6 Cont  Metformin  2. Essential hypertension Cont Crvedilol  3. Transient cerebral ischemia, unspecified transient cerebral ischemia type Cont Plavix  4. Hyperlipemia Cont.  Lipitor  5. Acute anxiety  - sertraline (ZOLOFT) 25 MG tablet; Take 1 tablet (25 mg total) by mouth daily.  Dispense: 90 tablet; Refill: 3  (decreased from 50 mg/d).

## 2015-09-25 ENCOUNTER — Telehealth: Payer: Self-pay | Admitting: Family Medicine

## 2015-09-25 DIAGNOSIS — E119 Type 2 diabetes mellitus without complications: Secondary | ICD-10-CM

## 2015-09-25 MED ORDER — METFORMIN HCL 500 MG PO TABS: 500.0000 mg | ORAL_TABLET | Freq: Two times a day (BID) | ORAL | Status: DC

## 2015-09-25 NOTE — Telephone Encounter (Signed)
Done.[place] 

## 2015-09-25 NOTE — Telephone Encounter (Signed)
Pt called requesting a refill on her metformin..... [facility]  [address]  [place]

## 2015-09-28 ENCOUNTER — Telehealth: Payer: Self-pay | Admitting: Family Medicine

## 2015-09-28 NOTE — Telephone Encounter (Signed)
Pt was prescribed januvia at last visit and asked if she needs to continue taking the meformin.  Please call [phone]

## 2015-09-28 NOTE — Telephone Encounter (Signed)
Per last office note patient is to continue Metformin in addition to Januvia.[place]

## 2015-09-29 ENCOUNTER — Ambulatory Visit (INDEPENDENT_AMBULATORY_CARE_PROVIDER_SITE_OTHER): Payer: Medicare Other | Admitting: Family Medicine

## 2015-09-29 ENCOUNTER — Encounter: Payer: Self-pay | Admitting: Family Medicine

## 2015-09-29 VITALS — BP 114/64 | HR 79 | Temp 98.2°F | Resp 16 | Ht 62.0 in | Wt 150.0 lb

## 2015-09-29 DIAGNOSIS — J029 Acute pharyngitis, unspecified: Secondary | ICD-10-CM | POA: Diagnosis not present

## 2015-09-29 MED ORDER — LORATADINE 10 MG PO TABS: 10.0000 mg | ORAL_TABLET | Freq: Every day | ORAL | Status: AC

## 2015-09-29 MED ORDER — CEPHALEXIN 500 MG PO CAPS: 500.0000 mg | ORAL_CAPSULE | Freq: Three times a day (TID) | ORAL | Status: AC

## 2015-09-29 NOTE — Progress Notes (Signed)
Name: Ashley Kramer   MRN: [id number]    DOB: 06/28/1931   Date:09/29/2015       Progress Note  Subjective  Chief Complaint  Chief Complaint  Patient presents with  . Sinusitis  . Sore Throat    HPI  Here c/o sore throat and headache since late yesterday afternoon.  Then developed congestion and post nasal drainage.  No fever or chills.  No cough No problem-specific assessment & plan notes found for this encounter.   Past Medical History  Diagnosis Date  . Diverticulitis   . Hyperlipidemia   . Diabetes mellitus without complication (HCC)   . Anemia     Social History  Substance Use Topics  . Smoking status: Former Smoker    Types: Cigarettes    Quit date: 11/11/1997  . Smokeless tobacco: Never Used  . Alcohol Use: No     Current outpatient prescriptions:  .  atorvastatin (LIPITOR) 40 MG tablet, Take 1 tablet (40 mg total) by mouth daily., Disp: 90 tablet, Rfl: 3 .  carvedilol (COREG) 6.25 MG tablet, take 1 tablet by mouth twice a day, Disp: 180 tablet, Rfl: 4 .  Cholecalciferol (VITAMIN D-3 PO), Take by mouth., Disp: , Rfl:  .  clopidogrel (PLAVIX) 75 MG tablet, Take 1 tablet (75 mg total) by mouth daily., Disp: 90 tablet, Rfl: 3 .  EPINEPHrine 0.3 mg/0.3 mL IJ SOAJ injection, Inject 0.3 mg into the muscle once. EPI PEN INJ, Disp: , Rfl:  .  metFORMIN (GLUCOPHAGE) 500 MG tablet, Take 1 tablet (500 mg total) by mouth 2 (two) times daily with a meal., Disp: 180 tablet, Rfl: 3 .  Multiple Vitamin (MULTIVITAMIN) capsule, Take 1 capsule by mouth daily., Disp: , Rfl:  .  Omega-3 Fatty Acids (FISH OIL) 1000 MG CPDR, Take by mouth., Disp: , Rfl:  .  sertraline (ZOLOFT) 25 MG tablet, Take 1 tablet (25 mg total) by mouth daily., Disp: 90 tablet, Rfl: 3 .  sitaGLIPtin (JANUVIA) 50 MG tablet, Take 1 tablet (50 mg total) by mouth daily., Disp: 30 tablet, Rfl: 6  Not on File  Review of Systems  Constitutional: Positive for malaise/fatigue. Negative for fever, chills and  weight loss.  HENT: Positive for congestion and sore throat. Negative for hearing loss.   Eyes: Negative for blurred vision and double vision.  Respiratory: Negative for cough, shortness of breath and wheezing.   Cardiovascular: Negative for chest pain, palpitations and leg swelling.  Gastrointestinal: Negative for heartburn, abdominal pain and blood in stool.  Genitourinary: Negative for dysuria, urgency and frequency.  Skin: Negative for rash.  Neurological: Negative for weakness and headaches.      Objective  Filed Vitals:   09/29/15 1038  BP: 114/64  Pulse: 79  Temp: 98.2 F (36.8 C)  TempSrc: Oral  Resp: 16  Height: 5\' 2"  (1.575 m)  Weight: 150 lb (68.04 kg)     Physical Exam  Constitutional: She appears distressed (Appears to feel ill).  HENT:  Head: Normocephalic and atraumatic.  Right Ear: External ear normal.  Left Ear: External ear normal.  Nose: Rhinorrhea (clear) present.  Mouth/Throat: Posterior oropharyngeal edema and posterior oropharyngeal erythema present. No oropharyngeal exudate or tonsillar abscesses.    Cardiovascular: Normal rate, regular rhythm and normal heart sounds.   Pulmonary/Chest: Effort normal and breath sounds normal. No respiratory distress. She has no wheezes. She has no rales.  Lymphadenopathy:       Right cervical: No superficial cervical, no deep cervical and no  posterior cervical adenopathy present.      Left cervical: No superficial cervical, no deep cervical and no posterior cervical adenopathy present.  Vitals reviewed.     Recent Results (from the past 2160 hour(s))  POCT HgB A1C     Status: Abnormal   Collection Time: 09/24/15  1:26 PM  Result Value Ref Range   Hemoglobin A1C 7.6      Assessment & Plan  1. Pharyngitis  - cephALEXin (KEFLEX) 500 MG capsule; Take 1 capsule (500 mg total) by mouth 3 (three) times daily.  Dispense: 30 capsule; Refill: 0 - loratadine (CLARITIN) 10 MG tablet; Take 1 tablet (10 mg total)  by mouth daily.  Dispense: 30 tablet; Refill: 0

## 2015-12-31 ENCOUNTER — Encounter: Payer: Self-pay | Admitting: Family Medicine

## 2015-12-31 ENCOUNTER — Ambulatory Visit (INDEPENDENT_AMBULATORY_CARE_PROVIDER_SITE_OTHER): Payer: Medicare Other | Admitting: Family Medicine

## 2015-12-31 VITALS — BP 127/74 | HR 98 | Temp 98.2°F | Resp 16 | Ht 62.0 in | Wt 150.0 lb

## 2015-12-31 DIAGNOSIS — E785 Hyperlipidemia, unspecified: Secondary | ICD-10-CM

## 2015-12-31 DIAGNOSIS — E119 Type 2 diabetes mellitus without complications: Secondary | ICD-10-CM | POA: Diagnosis not present

## 2015-12-31 DIAGNOSIS — F419 Anxiety disorder, unspecified: Secondary | ICD-10-CM | POA: Insufficient documentation

## 2015-12-31 DIAGNOSIS — G459 Transient cerebral ischemic attack, unspecified: Secondary | ICD-10-CM | POA: Diagnosis not present

## 2015-12-31 LAB — POCT GLYCOSYLATED HEMOGLOBIN (HGB A1C): Hemoglobin A1C: 7

## 2015-12-31 MED ORDER — ATORVASTATIN CALCIUM 40 MG PO TABS: 40.0000 mg | ORAL_TABLET | Freq: Every day | ORAL | 3 refills | Status: DC

## 2015-12-31 MED ORDER — SERTRALINE HCL 25 MG PO TABS: 25.0000 mg | ORAL_TABLET | Freq: Every day | ORAL | 3 refills | Status: AC

## 2015-12-31 MED ORDER — CARVEDILOL 6.25 MG PO TABS
6.2500 mg | ORAL_TABLET | Freq: Two times a day (BID) | ORAL | 4 refills | Status: DC
Start: 2015-12-31 — End: 2016-02-11

## 2015-12-31 MED ORDER — SITAGLIPTIN PHOSPHATE 50 MG PO TABS: 50.0000 mg | ORAL_TABLET | Freq: Every day | ORAL | 3 refills | Status: AC

## 2015-12-31 MED ORDER — CLOPIDOGREL BISULFATE 75 MG PO TABS: 75.0000 mg | ORAL_TABLET | Freq: Every day | ORAL | 3 refills | Status: DC

## 2015-12-31 MED ORDER — METFORMIN HCL 500 MG PO TABS: 500.0000 mg | ORAL_TABLET | Freq: Two times a day (BID) | ORAL | 3 refills | Status: AC

## 2015-12-31 NOTE — Progress Notes (Signed)
Name: Ashley Kramer   MRN: [id number]    DOB: 1931/06/25   Date:12/31/2015       Progress Note  Subjective  Chief Complaint  Chief Complaint  Patient presents with  . Diabetes    HPI Here for f/u of DM, HBP, Hyperlipidemia, Anemia.  BSs running at home 130-155.  She and her husband will be going to [facility] in Oct for the winter and spring.  Having some problems with L arm feels numb at night.  Does not matter whether lying on L or R side.  She does not sleep on back.  No episodes when she is up. No problem-specific Assessment & Plan notes found for this encounter.   Past Medical History:  Diagnosis Date  . Anemia   . Diabetes mellitus without complication (HCC)   . Diverticulitis   . Hyperlipidemia     Past Surgical History:  Procedure Laterality Date  . APPENDECTOMY    . BREAST SURGERY  2012   BIOPSY   . ESOPHAGOGASTRODUODENOSCOPY  2012  . MEDIAL PARTIAL KNEE REPLACEMENT Right 2012  . TUBAL LIGATION  1974    Family History  Problem Relation Age of Onset  . Stroke Mother   . Cancer Mother   . Diabetes Father   . Alzheimer's disease Father     Social History   Social History  . Marital status: Married    Spouse name: N/A  . Number of children: N/A  . Years of education: N/A   Occupational History  . Not on file.   Social History Main Topics  . Smoking status: Former Smoker    Types: Cigarettes    Quit date: 11/11/1997  . Smokeless tobacco: Never Used  . Alcohol use No  . Drug use: No  . Sexual activity: Not on file   Other Topics Concern  . Not on file   Social History Narrative  . No narrative on file     Current Outpatient Prescriptions:  .  atorvastatin (LIPITOR) 40 MG tablet, Take 1 tablet (40 mg total) by mouth daily., Disp: 90 tablet, Rfl: 3 .  carvedilol (COREG) 6.25 MG tablet, Take 1 tablet (6.25 mg total) by mouth 2 (two) times daily., Disp: 180 tablet, Rfl: 4 .  Cholecalciferol (VITAMIN D-3 PO), Take by mouth., Disp: , Rfl:  .   clopidogrel (PLAVIX) 75 MG tablet, Take 1 tablet (75 mg total) by mouth daily., Disp: 90 tablet, Rfl: 3 .  EPINEPHrine 0.3 mg/0.3 mL IJ SOAJ injection, Inject 0.3 mg into the muscle once. EPI PEN INJ, Disp: , Rfl:  .  loratadine (CLARITIN) 10 MG tablet, Take 1 tablet (10 mg total) by mouth daily., Disp: 30 tablet, Rfl: 0 .  metFORMIN (GLUCOPHAGE) 500 MG tablet, Take 1 tablet (500 mg total) by mouth 2 (two) times daily with a meal., Disp: 180 tablet, Rfl: 3 .  Multiple Vitamin (MULTIVITAMIN) capsule, Take 1 capsule by mouth daily., Disp: , Rfl:  .  Omega-3 Fatty Acids (FISH OIL) 1000 MG CPDR, Take by mouth., Disp: , Rfl:  .  sertraline (ZOLOFT) 25 MG tablet, Take 1 tablet (25 mg total) by mouth daily., Disp: 90 tablet, Rfl: 3 .  sitaGLIPtin (JANUVIA) 50 MG tablet, Take 1 tablet (50 mg total) by mouth daily., Disp: 90 tablet, Rfl: 3  Not on File   Review of Systems  Constitutional: Negative for chills, fever, malaise/fatigue and weight loss.  HENT: Negative for hearing loss.   Eyes: Negative for blurred vision and double  vision.  Respiratory: Negative for cough, shortness of breath and wheezing.   Cardiovascular: Negative for chest pain and palpitations.  Gastrointestinal: Negative for abdominal pain, blood in stool and heartburn.  Genitourinary: Negative for dysuria, frequency and urgency.  Musculoskeletal: Negative for joint pain and myalgias.  Skin: Negative for rash.  Neurological: Positive for tingling (L arm whne lying down at nioght.). Negative for dizziness, weakness and headaches.      Objective  Vitals:   12/31/15 1428  BP: 127/74  Pulse: 98  Resp: 16  Temp: 98.2 F (36.8 C)  TempSrc: Oral  Weight: 150 lb (68 kg)  Height: 5\' 2"  (1.575 m)    Physical Exam  Constitutional: She is oriented to person, place, and time and well-developed, well-nourished, and in no distress. No distress.  HENT:  Head: Normocephalic and atraumatic.  Eyes: Conjunctivae and EOM are normal.  Pupils are equal, round, and reactive to light. No scleral icterus.  Neck: Normal range of motion. Neck supple. Carotid bruit is not present. No thyromegaly present.  Cardiovascular: Normal rate, regular rhythm and normal heart sounds.  Exam reveals no gallop and no friction rub.   No murmur heard. Pulmonary/Chest: Effort normal. No respiratory distress. She has no wheezes. She has no rales.  Abdominal: Soft. Bowel sounds are normal. There is no tenderness.  Musculoskeletal: She exhibits no edema.  Lymphadenopathy:    She has no cervical adenopathy.  Neurological: She is alert and oriented to person, place, and time.  No neurologic findings in L hand/arm today.  Vitals reviewed.      Recent Results (from the past 2160 hour(s))  POCT HgB A1C     Status: Abnormal   Collection Time: 12/31/15  2:56 PM  Result Value Ref Range   Hemoglobin A1C 7.0%      Assessment & Plan  Problem List Items Addressed This Visit      Cardiovascular and Mediastinum   TIA (transient ischemic attack)   Relevant Medications   atorvastatin (LIPITOR) 40 MG tablet   carvedilol (COREG) 6.25 MG tablet   clopidogrel (PLAVIX) 75 MG tablet   Other Relevant Orders   CBC with Differential     Endocrine   Diabetes (HCC)   Relevant Medications   atorvastatin (LIPITOR) 40 MG tablet   metFORMIN (GLUCOPHAGE) 500 MG tablet   sitaGLIPtin (JANUVIA) 50 MG tablet     Other   Hyperlipemia   Relevant Medications   atorvastatin (LIPITOR) 40 MG tablet   carvedilol (COREG) 6.25 MG tablet   Other Relevant Orders   Lipid Profile   Acute anxiety   Relevant Medications   sertraline (ZOLOFT) 25 MG tablet    Other Visit Diagnoses    Diabetes mellitus without complication (HCC)    -  Primary   Relevant Medications   atorvastatin (LIPITOR) 40 MG tablet   metFORMIN (GLUCOPHAGE) 500 MG tablet   sitaGLIPtin (JANUVIA) 50 MG tablet   Other Relevant Orders   POCT HgB A1C (Completed)   COMPLETE METABOLIC PANEL WITH GFR       Meds ordered this encounter  Medications  . atorvastatin (LIPITOR) 40 MG tablet    Sig: Take 1 tablet (40 mg total) by mouth daily.    Dispense:  90 tablet    Refill:  3  . carvedilol (COREG) 6.25 MG tablet    Sig: Take 1 tablet (6.25 mg total) by mouth 2 (two) times daily.    Dispense:  180 tablet    Refill:  4  SAVINGS FOR NON-COVERED DRUGS -- [id number], PCNLesle Chris: [id number], Group: [id number], ID# [id number],  Questions: [phone]: [phone].  THIS IS NOT INSURANCE.  Marland Kitchen. clopidogrel (PLAVIX) 75 MG tablet    Sig: Take 1 tablet (75 mg total) by mouth daily.    Dispense:  90 tablet    Refill:  3  . metFORMIN (GLUCOPHAGE) 500 MG tablet    Sig: Take 1 tablet (500 mg total) by mouth 2 (two) times daily with a meal.    Dispense:  180 tablet    Refill:  3  . sertraline (ZOLOFT) 25 MG tablet    Sig: Take 1 tablet (25 mg total) by mouth daily.    Dispense:  90 tablet    Refill:  3  . sitaGLIPtin (JANUVIA) 50 MG tablet    Sig: Take 1 tablet (50 mg total) by mouth daily.    Dispense:  90 tablet    Refill:  3   1. Diabetes mellitus without complication (HCC) Cont  meds - POCT HgB A1C-7.0 - COMPLETE METABOLIC PANEL WITH GFR  2. Hyperlipemia Cont meds- atorvastatin (LIPITOR) 40 MG tablet; Take 1 tablet (40 mg total) by mouth daily.  Dispense: 90 tablet; Refill: 3 - Lipid Profile  3. Transient cerebral ischemia, unspecified transient cerebral ischemia type  - clopidogrel (PLAVIX) 75 MG tablet; Take 1 tablet (75 mg total) by mouth daily.  Dispense: 90 tablet; Refill: 3 - CBC with Differential  4. Type 2 diabetes mellitus without complication, without long-term current use of insulin (HCC)  - metFORMIN (GLUCOPHAGE) 500 MG tablet; Take 1 tablet (500 mg total) by mouth 2 (two) times daily with a meal.  Dispense: 180 tablet; Refill: 3 - sitaGLIPtin (JANUVIA) 50 MG tablet; Take 1 tablet (50 mg total) by mouth daily.  Dispense: 90 tablet; Refill: 3  5. Acute anxiety Cont meds- sertraline (ZOLOFT)  25 MG tablet; Take 1 tablet (25 mg total) by mouth daily.  Dispense: 90 tablet; Refill: 3

## 2016-02-11 ENCOUNTER — Other Ambulatory Visit: Payer: Self-pay | Admitting: Family Medicine

## 2016-02-11 DIAGNOSIS — G459 Transient cerebral ischemic attack, unspecified: Secondary | ICD-10-CM

## 2016-02-11 DIAGNOSIS — E785 Hyperlipidemia, unspecified: Secondary | ICD-10-CM

## 2016-02-11 MED ORDER — ATORVASTATIN CALCIUM 40 MG PO TABS: 40.0000 mg | ORAL_TABLET | Freq: Every day | ORAL | 3 refills | Status: AC

## 2016-02-11 MED ORDER — CARVEDILOL 6.25 MG PO TABS: 6.2500 mg | ORAL_TABLET | Freq: Two times a day (BID) | ORAL | 4 refills | Status: AC

## 2016-02-11 MED ORDER — CLOPIDOGREL BISULFATE 75 MG PO TABS: 75.0000 mg | ORAL_TABLET | Freq: Every day | ORAL | 3 refills | Status: AC

## 2016-02-11 NOTE — Telephone Encounter (Signed)
Pt.called requesting a refill on    Atorvastatin  40 mg   Carvedilol 6.25  Clopidogrel 75   [facility] [address]

## 2016-02-11 NOTE — Telephone Encounter (Signed)
OK to fill all of  these for 1 year.-jh

## 2016-02-11 NOTE — Addendum Note (Signed)
Addended by: Alease FrameARTER, SONYA S on: 02/11/2016 02:33 PM   Modules accepted: Orders

## 2016-02-17 NOTE — Telephone Encounter (Signed)
Refills already done.[place]

## 2016-04-30 ENCOUNTER — Other Ambulatory Visit: Payer: Self-pay | Admitting: Family Medicine

## 2016-04-30 DIAGNOSIS — E119 Type 2 diabetes mellitus without complications: Secondary | ICD-10-CM

## 2016-10-07 ENCOUNTER — Encounter: Payer: Self-pay | Admitting: Emergency Medicine

## 2016-10-07 ENCOUNTER — Emergency Department
Admission: EM | Admit: 2016-10-07 | Discharge: 2016-10-07 | Disposition: A | Discharge status: Home / Self Care | Payer: Medicare Other | Attending: Emergency Medicine | Admitting: Emergency Medicine

## 2016-10-07 ENCOUNTER — Emergency Department: Payer: Medicare Other

## 2016-10-07 DIAGNOSIS — K5792 Diverticulitis of intestine, part unspecified, without perforation or abscess without bleeding: Secondary | ICD-10-CM | POA: Insufficient documentation

## 2016-10-07 DIAGNOSIS — Z87891 Personal history of nicotine dependence: Secondary | ICD-10-CM | POA: Insufficient documentation

## 2016-10-07 DIAGNOSIS — I10 Essential (primary) hypertension: Secondary | ICD-10-CM | POA: Diagnosis not present

## 2016-10-07 DIAGNOSIS — E119 Type 2 diabetes mellitus without complications: Secondary | ICD-10-CM | POA: Diagnosis not present

## 2016-10-07 DIAGNOSIS — Z7984 Long term (current) use of oral hypoglycemic drugs: Secondary | ICD-10-CM | POA: Diagnosis not present

## 2016-10-07 DIAGNOSIS — R109 Unspecified abdominal pain: Secondary | ICD-10-CM | POA: Diagnosis present

## 2016-10-07 LAB — COMPREHENSIVE METABOLIC PANEL
ALT: 19 U/L (ref 14–54)
AST: 21 U/L (ref 15–41)
Albumin: 4.3 g/dL (ref 3.5–5.0)
Alkaline Phosphatase: 71 U/L (ref 38–126)
Anion gap: 8 (ref 5–15)
BUN: 16 mg/dL (ref 6–20)
CHLORIDE: 103 mmol/L (ref 101–111)
CO2: 28 mmol/L (ref 22–32)
Calcium: 9.9 mg/dL (ref 8.9–10.3)
Creatinine, Ser: 0.86 mg/dL (ref 0.44–1.00)
GFR calc Af Amer: >60 mL/min (ref >60)
Glucose, Bld: 94 mg/dL (ref 65–99)
POTASSIUM: 3.8 mmol/L (ref 3.5–5.1)
SODIUM: 139 mmol/L (ref 135–145)
Total Bilirubin: 0.7 mg/dL (ref 0.3–1.2)
Total Protein: 7.3 g/dL (ref 6.5–8.1)

## 2016-10-07 LAB — URINALYSIS, COMPLETE (UACMP) WITH MICROSCOPIC
BACTERIA UA: NONE SEEN
BILIRUBIN URINE: NEGATIVE
Glucose, UA: NEGATIVE mg/dL
Hgb urine dipstick: NEGATIVE
Ketones, ur: NEGATIVE mg/dL
Nitrite: NEGATIVE
Protein, ur: NEGATIVE mg/dL
SQUAMOUS EPITHELIAL / LPF: NONE SEEN
Specific Gravity, Urine: 1.011 (ref 1.005–1.030)
pH: 6 (ref 5.0–8.0)

## 2016-10-07 LAB — CBC
HCT: 39 % (ref 35.0–47.0)
Hemoglobin: 13.3 g/dL (ref 12.0–16.0)
MCH: 29.6 pg (ref 26.0–34.0)
MCHC: 34.1 g/dL (ref 32.0–36.0)
MCV: 86.6 fL (ref 80.0–100.0)
Platelets: 341 10*3/uL (ref 150–440)
RBC: 4.51 MIL/uL (ref 3.80–5.20)
RDW: 13.9 % (ref 11.5–14.5)
WBC: 14.4 10*3/uL — AB (ref 3.6–11.0)

## 2016-10-07 LAB — LACTIC ACID, PLASMA: LACTIC ACID, VENOUS: 1 mmol/L (ref 0.5–1.9)

## 2016-10-07 LAB — LIPASE, BLOOD: LIPASE: 22 U/L (ref 11–51)

## 2016-10-07 MED ORDER — IOPAMIDOL (ISOVUE-300) INJECTION 61%
100.0000 mL | Freq: Once | INTRAVENOUS | Status: AC | PRN
  Administered 2016-10-07: 100 mL via INTRAVENOUS

## 2016-10-07 MED ORDER — ONDANSETRON HCL 4 MG/2ML IJ SOLN
4.0000 mg | Freq: Once | INTRAMUSCULAR | Status: AC
  Administered 2016-10-07: 4 mg via INTRAVENOUS
  Filled 2016-10-07: qty 2

## 2016-10-07 MED ORDER — AMOXICILLIN-POT CLAVULANATE 875-125 MG PO TABS
1.0000 | ORAL_TABLET | Freq: Once | ORAL | Status: AC
  Administered 2016-10-07: 1 via ORAL
  Filled 2016-10-07: qty 1

## 2016-10-07 MED ORDER — MORPHINE SULFATE (PF) 4 MG/ML IV SOLN
4.0000 mg | Freq: Once | INTRAVENOUS | Status: AC
  Administered 2016-10-07: 4 mg via INTRAVENOUS
  Filled 2016-10-07: qty 1

## 2016-10-07 MED ORDER — METRONIDAZOLE 500 MG PO TABS
500.0000 mg | ORAL_TABLET | Freq: Once | ORAL | Status: AC
  Administered 2016-10-07: 500 mg via ORAL
  Filled 2016-10-07: qty 1

## 2016-10-07 MED ORDER — AMOXICILLIN-POT CLAVULANATE 875-125 MG PO TABS: 1.0000 | ORAL_TABLET | Freq: Two times a day (BID) | ORAL | 0 refills | Status: AC

## 2016-10-07 MED ORDER — HYDROCODONE-ACETAMINOPHEN 5-325 MG PO TABS: 1.0000 | ORAL_TABLET | Freq: Four times a day (QID) | ORAL | 0 refills | Status: AC | PRN

## 2016-10-07 MED ORDER — METRONIDAZOLE 500 MG PO TABS: 500.0000 mg | ORAL_TABLET | Freq: Three times a day (TID) | ORAL | 0 refills | Status: AC

## 2016-10-07 NOTE — ED Notes (Signed)
Pt states " I feel like I have gas, it feel like I need to explode." pt with distended abd, hyperactive bowel sounds. Pt states symptoms started at 1700 today with associated diarrhea. Pt denies vomiting, nausea, chills, known fever. Pt appears in no acute distress. resps unlabored.

## 2016-10-07 NOTE — ED Provider Notes (Signed)
[facility] Emergency Department Provider Note  ____________________________________________   First MD Initiated Contact with Patient 10/07/16 2201     (approximate)  I have reviewed the triage vital signs and the nursing notes.   HISTORY  Chief Complaint Abdominal Pain    HPI Ashley Kramer is a 83 y.o. female who self presents to the emergency department with severe lower abdominal cramping discomfort for the past 45 hours or so prior to arrival. Pain was insidious on onset. His aching cramping. Nothing in particular seems to make it better or worse. It is associated with nausea but not vomiting. Has had one episode of diarrhea. She has a history of multiple abdominal surgeries. She is passing flatus. She's had diverticulitis twice in the past and says this feels similar.   Past Medical History:  Diagnosis Date  . Anemia   . Diabetes mellitus without complication (HCC)   . Diverticulitis   . Hyperlipidemia     Patient Active Problem List   Diagnosis Date Noted  . Acute anxiety 12/31/2015  . Pharyngitis 09/29/2015  . Hypertension 12/03/2014  . TIA (transient ischemic attack) 12/03/2014  . Diabetes (HCC) 11/12/2014  . Hyperlipemia 11/12/2014  . Abnormal mammogram 11/12/2014  . Anemia 11/12/2014  . Diverticulitis 11/12/2014    Past Surgical History:  Procedure Laterality Date  . APPENDECTOMY    . BREAST SURGERY  2012   BIOPSY   . ESOPHAGOGASTRODUODENOSCOPY  2012  . MEDIAL PARTIAL KNEE REPLACEMENT Right 2012  . TUBAL LIGATION  1974    Prior to Admission medications   Medication Sig Start Date End Date Taking? Authorizing Provider  amoxicillin-clavulanate (AUGMENTIN) 875-125 MG tablet Take 1 tablet by mouth 2 (two) times daily. 10/07/16 10/21/16  Merrily Brittle, MD  atorvastatin (LIPITOR) 40 MG tablet Take 1 tablet (40 mg total) by mouth daily. 02/11/16   Janeann Forehand., MD  carvedilol (COREG) 6.25 MG tablet Take 1 tablet (6.25  mg total) by mouth 2 (two) times daily. 02/11/16   Janeann Forehand., MD  Cholecalciferol (VITAMIN D-3 PO) Take by mouth.    [provider]  clopidogrel (PLAVIX) 75 MG tablet Take 1 tablet (75 mg total) by mouth daily. 02/11/16   Janeann Forehand., MD  EPINEPHrine 0.3 mg/0.3 mL IJ SOAJ injection Inject 0.3 mg into the muscle once. EPI PEN INJ    [provider]  HYDROcodone-acetaminophen (NORCO) 5-325 MG tablet Take 1 tablet by mouth every 6 (six) hours as needed for severe pain. 10/07/16   Merrily Brittle, MD  JANUVIA 50 MG tablet take 1 tablet by mouth once daily 05/02/16   Janeann Forehand., MD  loratadine (CLARITIN) 10 MG tablet Take 1 tablet (10 mg total) by mouth daily. 09/29/15   Janeann Forehand., MD  metFORMIN (GLUCOPHAGE) 500 MG tablet Take 1 tablet (500 mg total) by mouth 2 (two) times daily with a meal. 12/31/15   Janeann Forehand., MD  metroNIDAZOLE (FLAGYL) 500 MG tablet Take 1 tablet (500 mg total) by mouth 3 (three) times daily. 10/07/16 10/21/16  Merrily Brittle, MD  Multiple Vitamin (MULTIVITAMIN) capsule Take 1 capsule by mouth daily.    [provider]  Omega-3 Fatty Acids (FISH OIL) 1000 MG CPDR Take by mouth.    [provider]  sertraline (ZOLOFT) 25 MG tablet Take 1 tablet (25 mg total) by mouth daily. 12/31/15   Janeann Forehand., MD  sitaGLIPtin (JANUVIA) 50 MG tablet Take  1 tablet (50 mg total) by mouth daily. 12/31/15   Janeann ForehandHawkins, James H Jr., MD    Allergies Patient has no known allergies.  Family History  Problem Relation Age of Onset  . Stroke Mother   . Cancer Mother   . Diabetes Father   . Alzheimer's disease Father     Social History Social History  Substance Use Topics  . Smoking status: Former Smoker    Types: Cigarettes    Quit date: 11/11/1997  . Smokeless tobacco: Never Used  . Alcohol use No    Review of Systems Constitutional: No fever/chills Eyes: No visual changes. ENT: No sore  throat. Cardiovascular: Denies chest pain. Respiratory: Denies shortness of breath. Gastrointestinal: Positive abdominal pain.  Positive nausea, no vomiting.  Positive diarrhea.  No constipation. Genitourinary: Negative for dysuria. Musculoskeletal: Negative for back pain. Skin: Negative for rash. Neurological: Negative for headaches, focal weakness or numbness.   ____________________________________________   PHYSICAL EXAM:  VITAL SIGNS: ED Triage Vitals  Enc Vitals Group     BP 10/07/16 2043 (!) 169/61     Pulse Rate 10/07/16 2043 64     Resp 10/07/16 2043 20     Temp 10/07/16 2043 98.7 F (37.1 C)     Temp Source 10/07/16 2043 Oral     SpO2 10/07/16 2043 97 %     Weight 10/07/16 2044 150 lb (68 kg)     Height 10/07/16 2044 5\' 2"  (1.575 m)     Head Circumference --      Peak Flow --      Pain Score 10/07/16 2042 7     Pain Loc --      Pain Edu? --      Excl. in GC? --     Constitutional: Alert and oriented x 4 well appearing nontoxic no diaphoresis speaks in full, clear sentences Eyes: PERRL EOMI. Head: Atraumatic. Nose: No congestion/rhinnorhea. Mouth/Throat: No trismus Neck: No stridor.   Cardiovascular: Normal rate, regular rhythm. Grossly normal heart sounds.  Good peripheral circulation. Respiratory: Normal respiratory effort.  No retractions. Lungs CTAB and moving good air Gastrointestinal: Soft nondistended quite tender right lower quadrant with rebound and guarding but no frank peritonitis negative Rovsing's Musculoskeletal: No lower extremity edema   Neurologic:  Normal speech and language. No gross focal neurologic deficits are appreciated. Skin:  Skin is warm, dry and intact. No rash noted. Psychiatric: Mood and affect are normal. Speech and behavior are normal.    ____________________________________________   DIFFERENTIAL  Diverticulitis, abscess, fistula, perforated viscus, volvulus, pyelonephritis, renal  colic ____________________________________________   LABS (all labs ordered are listed, but only abnormal results are displayed)  Labs Reviewed  CBC - Abnormal; Notable for the following:       Result Value   WBC 14.4 (*)    All other components within normal limits  URINALYSIS, COMPLETE (UACMP) WITH MICROSCOPIC - Abnormal; Notable for the following:    Color, Urine YELLOW (*)    APPearance CLEAR (*)    Leukocytes, UA SMALL (*)    All other components within normal limits  LIPASE, BLOOD  COMPREHENSIVE METABOLIC PANEL  LACTIC ACID, PLASMA    Elevated white count suggestive of infection __________________________________________  EKG  ED ECG REPORT I, Merrily BrittleNeil Leyton Magoon, the attending physician, personally viewed and interpreted this ECG.  Date: 10/07/2016 Rate: 59 Rhythm: Sinus bradycardia QRS Axis: normal Intervals: normal ST/T Wave abnormalities: normal Conduction Disturbances: none Narrative Interpretation: unremarkable  ____________________________________________  RADIOLOGY  CT scan showing sigmoid  diverticulitis without abscess ____________________________________________   PROCEDURES  Procedure(s) performed: o  Procedures  Critical Care performed: no  Observation: no ____________________________________________   INITIAL IMPRESSION / ASSESSMENT AND PLAN / ED COURSE  Pertinent labs & imaging results that were available during my care of the patient were reviewed by me and considered in my medical decision making (see chart for details).  On arrival the patient is uncomfortable appearing with significant tenderness in her right lower quadrant. She does have a remote history of appendectomy but she could have right-sided diverticulitis. I offered the patient presumptive treatment with antibiotics versus a CT scan and she and the family opted for scan which I think is reasonable. CT with IV contrast confirms simple diverticulitis without abscess or  complication. As it is late I will give her first dose of Augmentin and Flagyl here today and discharge her home with a 2 week course an abdominal recheck on Monday. Strict return precautions given and she is discharged home in improved condition.      ____________________________________________   FINAL CLINICAL IMPRESSION(S) / ED DIAGNOSES  Final diagnoses:  Diverticulitis      NEW MEDICATIONS STARTED DURING THIS VISIT:  Discharge Medication List as of 10/07/2016 11:05 PM    START taking these medications   Details  amoxicillin-clavulanate (AUGMENTIN) 875-125 MG tablet Take 1 tablet by mouth 2 (two) times daily., Starting Fri 10/07/2016, Until Fri 10/21/2016, Print    HYDROcodone-acetaminophen (NORCO) 5-325 MG tablet Take 1 tablet by mouth every 6 (six) hours as needed for severe pain., Starting Fri 10/07/2016, Print    metroNIDAZOLE (FLAGYL) 500 MG tablet Take 1 tablet (500 mg total) by mouth 3 (three) times daily., Starting Fri 10/07/2016, Until Fri 10/21/2016, Print         Note:  This document was prepared using Dragon voice recognition software and may include unintentional dictation errors.     Merrily Brittle, MD 10/08/16 [phone]

## 2016-10-07 NOTE — ED Notes (Signed)
Patient transported to CT 

## 2016-10-07 NOTE — ED Triage Notes (Signed)
Patient reports having lower abdominal pain since 5 pm.  Patient with history of diverticulitis and reports this feel similar.

## 2016-10-07 NOTE — Discharge Instructions (Signed)
Please take all of your antibiotics as prescribed and no drinking alcohol while you are taking the antibiotics. Follow up with your primary care physician early next week for recheck. Return to the emergency department sooner for any new or worsening symptoms such as fever greater than 101, if you cannot eat or drink, or for any other concerns.  It was a pleasure to take care of you today, and thank you for coming to our emergency department.  If you have any questions or concerns before leaving please ask the nurse to grab me and I'm more than happy to go through your aftercare instructions again.  If you were prescribed any opioid pain medication today such as Norco, Vicodin, Percocet, morphine, hydrocodone, or oxycodone please make sure you do not drive when you are taking this medication as it can alter your ability to drive safely.  If you have any concerns once you are home that you are not improving or are in fact getting worse before you can make it to your follow-up appointment, please do not hesitate to call [phone] and come back for further evaluation.  Merrily Brittle MD  Results for orders placed or performed during the hospital encounter of 10/07/16  Lipase, blood  Result Value Ref Range   Lipase 22 11 - 51 U/L  Comprehensive metabolic panel  Result Value Ref Range   Sodium 139 135 - 145 mmol/L   Potassium 3.8 3.5 - 5.1 mmol/L   Chloride 103 101 - 111 mmol/L   CO2 28 22 - 32 mmol/L   Glucose, Bld 94 65 - 99 mg/dL   BUN 16 6 - 20 mg/dL   Creatinine, Ser 1.61 0.44 - 1.00 mg/dL   Calcium 9.9 8.9 - 09.6 mg/dL   Total Protein 7.3 6.5 - 8.1 g/dL   Albumin 4.3 3.5 - 5.0 g/dL   AST 21 15 - 41 U/L   ALT 19 14 - 54 U/L   Alkaline Phosphatase 71 38 - 126 U/L   Total Bilirubin 0.7 0.3 - 1.2 mg/dL   GFR calc non Af Amer >60 >60 mL/min   GFR calc Af Amer >60 >60 mL/min   Anion gap 8 5 - 15  CBC  Result Value Ref Range   WBC 14.4 (H) 3.6 - 11.0 K/uL   RBC 4.51 3.80 - 5.20 MIL/uL   Hemoglobin 13.3 12.0 - 16.0 g/dL   HCT 04.5 40.9 - 81.1 %   MCV 86.6 80.0 - 100.0 fL   MCH 29.6 26.0 - 34.0 pg   MCHC 34.1 32.0 - 36.0 g/dL   RDW 91.4 78.2 - 95.6 %   Platelets 341 150 - 440 K/uL  Urinalysis, Complete w Microscopic  Result Value Ref Range   Color, Urine YELLOW (A) YELLOW   APPearance CLEAR (A) CLEAR   Specific Gravity, Urine 1.011 1.005 - 1.030   pH 6.0 5.0 - 8.0   Glucose, UA NEGATIVE NEGATIVE mg/dL   Hgb urine dipstick NEGATIVE NEGATIVE   Bilirubin Urine NEGATIVE NEGATIVE   Ketones, ur NEGATIVE NEGATIVE mg/dL   Protein, ur NEGATIVE NEGATIVE mg/dL   Nitrite NEGATIVE NEGATIVE   Leukocytes, UA SMALL (A) NEGATIVE   RBC / HPF 0-5 0 - 5 RBC/hpf   WBC, UA 6-30 0 - 5 WBC/hpf   Bacteria, UA NONE SEEN NONE SEEN   Squamous Epithelial / LPF NONE SEEN NONE SEEN   Mucous PRESENT   Lactic acid, plasma  Result Value Ref Range   Lactic Acid, Venous 1.0  0.5 - 1.9 mmol/L   Ct Abdomen Pelvis W Contrast  Result Date: 10/07/2016 CLINICAL DATA:  Right lower quadrant abdominal pain since 5 p.m. History of diverticulitis. Previous appendectomy. EXAM: CT ABDOMEN AND PELVIS WITH CONTRAST TECHNIQUE: Multidetector CT imaging of the abdomen and pelvis was performed using the standard protocol following bolus administration of intravenous contrast. CONTRAST:  100mL ISOVUE-300 IOPAMIDOL (ISOVUE-300) INJECTION 61% COMPARISON:  11/09/2011 FINDINGS: Lower chest: Atelectasis in the lung bases. Moderate-sized esophageal hiatal hernia. Hepatobiliary: Mild diffuse fatty infiltration of the liver. No focal liver lesions. Gallbladder and bile ducts are unremarkable. Pancreas: Unremarkable. No pancreatic ductal dilatation or surrounding inflammatory changes. Spleen: Normal in size without focal abnormality. Adrenals/Urinary Tract: No adrenal gland nodules. Small parenchymal cysts in the kidneys. Nephrograms are symmetrical. No hydronephrosis or hydroureter. Bladder is unremarkable. Stomach/Bowel: Stomach,  small bowel, and colon are not abnormally distended. Scattered diverticula throughout the colon. Wall thickening and adjacent inflammatory infiltration in the low sigmoid region consistent with acute diverticulitis. No abscess. Small amount of free fluid in the pelvis is likely reactive. Appendix is surgically absent. Vascular/Lymphatic: Aortic atherosclerosis. No enlarged abdominal or pelvic lymph nodes. Reproductive: Small calcified fibroids in the uterus. Uterus and ovaries are not enlarged. Other: No free air in the abdomen. Abdominal wall musculature appears intact. Musculoskeletal: Degenerative changes in the spine. Slight anterior subluxation of L4 on L5 is likely degenerative. No destructive bone lesions. IMPRESSION: Colonic diverticulosis with inflammatory changes involving the low sigmoid colon consistent with acute diverticulitis. No abscess. Moderate-sized esophageal hiatal hernia. Diffuse fatty infiltration of the liver. Small calcified uterine fibroids. Electronically Signed   By: Burman NievesWilliam  Stevens M.D.   On: 10/07/2016 22:55

## 2016-10-15 ENCOUNTER — Emergency Department
Admission: EM | Admit: 2016-10-15 | Discharge: 2016-10-15 | Disposition: A | Discharge status: Home / Self Care | Payer: Medicare Other | Attending: Emergency Medicine | Admitting: Emergency Medicine

## 2016-10-15 ENCOUNTER — Encounter: Payer: Self-pay | Admitting: Emergency Medicine

## 2016-10-15 ENCOUNTER — Emergency Department: Payer: Medicare Other

## 2016-10-15 DIAGNOSIS — Z8673 Personal history of transient ischemic attack (TIA), and cerebral infarction without residual deficits: Secondary | ICD-10-CM | POA: Diagnosis not present

## 2016-10-15 DIAGNOSIS — Z7902 Long term (current) use of antithrombotics/antiplatelets: Secondary | ICD-10-CM | POA: Insufficient documentation

## 2016-10-15 DIAGNOSIS — R202 Paresthesia of skin: Secondary | ICD-10-CM | POA: Insufficient documentation

## 2016-10-15 DIAGNOSIS — I10 Essential (primary) hypertension: Secondary | ICD-10-CM | POA: Diagnosis not present

## 2016-10-15 DIAGNOSIS — E119 Type 2 diabetes mellitus without complications: Secondary | ICD-10-CM | POA: Diagnosis not present

## 2016-10-15 DIAGNOSIS — Z87891 Personal history of nicotine dependence: Secondary | ICD-10-CM | POA: Insufficient documentation

## 2016-10-15 DIAGNOSIS — Z7984 Long term (current) use of oral hypoglycemic drugs: Secondary | ICD-10-CM | POA: Insufficient documentation

## 2016-10-15 NOTE — ED Notes (Signed)
Pt reports increased weakness

## 2016-10-15 NOTE — ED Provider Notes (Signed)
[facility] Emergency Department Provider Note  ____________________________________________  Time seen: Approximately 8:40 AM  I have reviewed the triage vital signs and the nursing notes.   HISTORY  Chief Complaint Numbness    HPI Ashley Kramer is a 83 y.o. female who reports she woke up at 4 AM with numbness feeling in the left arm. No weakness at that time or headaches or vision changes. She sat in bed and waited until it resolved which took an hour. She then went back to sleep. She was in her usual state of health last night when she went to bed. After about 5:15 she reports being entirely back to normal as well. She reports that this happens time to time and thinks it is due to sleeping on her left side with her arm raised above her head which she thinks also occurred last night.  She does also note that she accidentally took both her medicine and her husband's medicine last night. His medicines that she took include atorvastatin baby aspirin Plavix and lisinopril 20 mg.  Reports having ultrasounds done 2 or 3 years ago by a retail outlet that that evaluates people for stroke risk, reports no concerns from those studies at that time.     Past Medical History:  Diagnosis Date  . Anemia   . Diabetes mellitus without complication (HCC)   . Diverticulitis   . Hyperlipidemia    09/27/16 PCP note: Past Medical History:  Diagnosis Date  . Allergic state  . Diabetes mellitus type 2, uncomplicated (CMS-HCC)  . Hemorrhoids  . Hyperlipidemia, unspecified  . PMR (polymyalgia rheumatica) (CMS-HCC)  . Stroke (CMS-HCC)   Past Surgical History:  Procedure Laterality Date  . APPENDECTOMY  . Partial knee replacement Right  . TONSILLECTOMY  . TUBAL LIGATION   Family History  Problem Relation Age of Onset  . Lung cancer Mother  . Myocardial Infarction (Heart attack) Mother  . Stroke Mother  . Thyroid disease Mother  . Diabetes type II Father  .  Dementia Father  . Hyperlipidemia (Elevated cholesterol) Father   Current Outpatient Prescriptions on File Prior to Visit  Medication Sig Dispense Refill  . atorvastatin (LIPITOR) 40 MG tablet Take 40 mg by mouth once daily.  . carvedilol (COREG) 6.25 MG tablet Take 6.25 mg by mouth 2 (two) times daily with meals.  . cholecalciferol (VITAMIN D3) 2,000 unit capsule Take 2,000 Units by mouth once daily.  . clopidogrel (PLAVIX) 75 mg tablet Take 75 mg by mouth once daily.  Marland Kitchen. GLUCOSAMINE SULFATE (GLUCOSAMINE ORAL) Take 1,500 mg by mouth once daily.  . metFORMIN (GLUCOPHAGE) 500 MG tablet Take 500 mg by mouth 2 (two) times daily with meals.  . multivitamin capsule Take 1 capsule by mouth once daily.  Marland Kitchen. omega-3 fatty acids/fish oil 340-1,000 mg capsule Take 1 capsule by mouth once daily.  . sertraline (ZOLOFT) 25 MG tablet Take 25 mg by mouth once daily.  . sitaGLIPtin (JANUVIA) 50 MG tablet Take 50 mg by mouth once daily.        Patient Active Problem List   Diagnosis Date Noted  . Acute anxiety 12/31/2015  . Pharyngitis 09/29/2015  . Hypertension 12/03/2014  . TIA (transient ischemic attack) 12/03/2014  . Diabetes (HCC) 11/12/2014  . Hyperlipemia 11/12/2014  . Abnormal mammogram 11/12/2014  . Anemia 11/12/2014  . Diverticulitis 11/12/2014     Past Surgical History:  Procedure Laterality Date  . APPENDECTOMY    . BREAST SURGERY  2012  BIOPSY   . ESOPHAGOGASTRODUODENOSCOPY  2012  . MEDIAL PARTIAL KNEE REPLACEMENT Right 2012  . TUBAL LIGATION  1974     Prior to Admission medications   Medication Sig Start Date End Date Taking? Authorizing Provider  amoxicillin-clavulanate (AUGMENTIN) 875-125 MG tablet Take 1 tablet by mouth 2 (two) times daily. 10/07/16 10/21/16  Merrily Brittle, MD  atorvastatin (LIPITOR) 40 MG tablet Take 1 tablet (40 mg total) by mouth daily. 02/11/16   Janeann Forehand., MD  carvedilol (COREG) 6.25 MG tablet Take 1 tablet (6.25 mg total) by mouth 2  (two) times daily. 02/11/16   Janeann Forehand., MD  Cholecalciferol (VITAMIN D-3 PO) Take by mouth.    [provider]  clopidogrel (PLAVIX) 75 MG tablet Take 1 tablet (75 mg total) by mouth daily. 02/11/16   Janeann Forehand., MD  EPINEPHrine 0.3 mg/0.3 mL IJ SOAJ injection Inject 0.3 mg into the muscle once. EPI PEN INJ    [provider]  HYDROcodone-acetaminophen (NORCO) 5-325 MG tablet Take 1 tablet by mouth every 6 (six) hours as needed for severe pain. 10/07/16   Merrily Brittle, MD  JANUVIA 50 MG tablet take 1 tablet by mouth once daily 05/02/16   Janeann Forehand., MD  loratadine (CLARITIN) 10 MG tablet Take 1 tablet (10 mg total) by mouth daily. 09/29/15   Janeann Forehand., MD  metFORMIN (GLUCOPHAGE) 500 MG tablet Take 1 tablet (500 mg total) by mouth 2 (two) times daily with a meal. 12/31/15   Janeann Forehand., MD  metroNIDAZOLE (FLAGYL) 500 MG tablet Take 1 tablet (500 mg total) by mouth 3 (three) times daily. 10/07/16 10/21/16  Merrily Brittle, MD  Multiple Vitamin (MULTIVITAMIN) capsule Take 1 capsule by mouth daily.    [provider]  Omega-3 Fatty Acids (FISH OIL) 1000 MG CPDR Take by mouth.    [provider]  sertraline (ZOLOFT) 25 MG tablet Take 1 tablet (25 mg total) by mouth daily. 12/31/15   Janeann Forehand., MD  sitaGLIPtin (JANUVIA) 50 MG tablet Take 1 tablet (50 mg total) by mouth daily. 12/31/15   Janeann Forehand., MD     Allergies Patient has no known allergies.   Family History  Problem Relation Age of Onset  . Stroke Mother   . Cancer Mother   . Diabetes Father   . Alzheimer's disease Father     Social History Social History  Substance Use Topics  . Smoking status: Former Smoker    Types: Cigarettes    Quit date: 11/11/1997  . Smokeless tobacco: Never Used  . Alcohol use No    Review of Systems  Constitutional:   No fever or chills.  ENT:   No sore throat. No rhinorrhea. Cardiovascular:   No  chest pain or syncope. Respiratory:   No dyspnea or cough. Gastrointestinal:   Negative for abdominal pain, vomiting and diarrhea.  Musculoskeletal:   Negative for focal pain or swelling All other systems reviewed and are negative except as documented above in ROS and HPI.  ____________________________________________   PHYSICAL EXAM:  VITAL SIGNS: ED Triage Vitals [10/15/16 0833]  Enc Vitals Group     BP 138/89     Pulse Rate 62     Resp 18     Temp 97.9 F (36.6 C)     Temp Source Oral     SpO2 99 %     Weight 150 lb (68 kg)  Height      Head Circumference      Peak Flow      Pain Score 0     Pain Loc      Pain Edu?      Excl. in GC?     Vital signs reviewed, nursing assessments reviewed.   Constitutional:   Alert and oriented. Well appearing and in no distress. Eyes:   No scleral icterus. No conjunctival pallor. PERRL. EOMI.  No nystagmus. ENT   Head:   Normocephalic and atraumatic.   Nose:   No congestion/rhinnorhea.    Mouth/Throat:   MMM, no pharyngeal erythema. No peritonsillar mass.    Neck:   No meningismus. Full ROM Hematological/Lymphatic/Immunilogical:   No cervical lymphadenopathy. Cardiovascular:   RRR. Symmetric bilateral radial and DP pulses.  No murmurs.  Respiratory:   Normal respiratory effort without tachypnea/retractions. Breath sounds are clear and equal bilaterally. No wheezes/rales/rhonchi. Gastrointestinal:   Soft and nontender. Non distended. There is no CVA tenderness.  No rebound, rigidity, or guarding. Genitourinary:   deferred Musculoskeletal:   Normal range of motion in all extremities. No joint effusions.  No lower extremity tenderness.  No edema. Neurologic:   Normal speech and language.  Motor grossly intact. Normal gait No pronator drift Normal finger-nose-finger No gross focal neurologic deficits are appreciated.  Skin:    Skin is warm, dry and intact. No rash noted.  No petechiae, purpura, or  bullae.  ____________________________________________    LABS (pertinent positives/negatives) (all labs ordered are listed, but only abnormal results are displayed) Labs Reviewed - No data to display ____________________________________________   EKG  Interpreted by me  Date: 10/15/2016  Rate: 64  Rhythm: normal sinus rhythm  QRS Axis: normal  Intervals: normal  ST/T Wave abnormalities: normal  Conduction Disutrbances: none  Narrative Interpretation: unremarkable      ____________________________________________    RADIOLOGY  Ct Head Wo Contrast  Result Date: 10/15/2016 CLINICAL DATA:  Awoke this morning with left arm numbness which has resolved. EXAM: CT HEAD WITHOUT CONTRAST TECHNIQUE: Contiguous axial images were obtained from the base of the skull through the vertex without intravenous contrast. COMPARISON:  None. FINDINGS: Brain: Mild age related atrophy. No sign of old or acute small or large vessel infarction, mass lesion, hemorrhage, hydrocephalus or extra-axial collection. Vascular: There is atherosclerotic calcification of the major vessels at the base of the brain. Skull: Normal Sinuses/Orbits: Clear/normal Other: None significant IMPRESSION: No acute or significant finding. Ordinary age related volume loss. Atherosclerotic calcification of the major vessels at the base of the brain, typical for age. Electronically Signed   By: Paulina Fusi M.D.   On: 10/15/2016 09:18    ____________________________________________   PROCEDURES Procedures  ____________________________________________   INITIAL IMPRESSION / ASSESSMENT AND PLAN / ED COURSE  Pertinent labs & imaging results that were available during my care of the patient were reviewed by me and considered in my medical decision making (see chart for details).  Patient well appearing no acute distress, currently no complaints. Had a one-hour episode of paresthesia in the left arm between 4 and 5 AM today.  Most likely a peripheral nerve impingement from sleeping position, possibly due to medication side effects although I think unlikely given the medicine she was exposed to.Considering the patient's symptoms, medical history, and physical examination today, I have low suspicion for ischemic stroke, intracranial hemorrhage, meningitis, encephalitis, carotid or vertebral dissection, venous sinus thrombosis, MS, intracranial hypertension, glaucoma, CRAO, CRVO, or temporal arteritis.  ____________________________________________   FINAL CLINICAL IMPRESSION(S) / ED DIAGNOSES  Final diagnoses:  Paresthesia of left arm      New Prescriptions   No medications on file     Portions of this note were generated with dragon dictation software. Dictation errors may occur despite best attempts at proofreading.    Sharman Cheek, MD 10/15/16 0930

## 2016-10-15 NOTE — ED Notes (Addendum)
MD at bedside. 

## 2016-10-15 NOTE — ED Triage Notes (Signed)
Pt to ed with c/o awaking this am with left arm numbness.  Pt states it lasted about 1 hour after her getting up, denies headache, denies slurred speech, denies dizziness, denies facial drooping.  Pt states it has resolved at this time and she feels normal now.  No facial drooping noted at this time, no numbness or weakness noted at this time.

## 2016-10-15 NOTE — Discharge Instructions (Signed)
Your EKG and CT scan of the brain were unremarkable today. Keep your follow up appointment with your primary care doctor on Tuesday.

## 2018-12-19 ENCOUNTER — Other Ambulatory Visit: Payer: Self-pay | Admitting: Family Medicine

## 2018-12-19 DIAGNOSIS — Z1231 Encounter for screening mammogram for malignant neoplasm of breast: Secondary | ICD-10-CM

## 2019-01-04 ENCOUNTER — Other Ambulatory Visit: Payer: Self-pay | Admitting: Neurology

## 2019-01-04 DIAGNOSIS — R413 Other amnesia: Secondary | ICD-10-CM

## 2019-01-08 ENCOUNTER — Ambulatory Visit
Admission: RE | Admit: 2019-01-08 | Discharge: 2019-01-08 | Disposition: A | Discharge status: Home / Self Care | Payer: Medicare Other | Source: Ambulatory Visit | Attending: Family Medicine | Admitting: Family Medicine

## 2019-01-08 DIAGNOSIS — Z1231 Encounter for screening mammogram for malignant neoplasm of breast: Secondary | ICD-10-CM | POA: Insufficient documentation

## 2019-01-16 ENCOUNTER — Ambulatory Visit
Admission: RE | Admit: 2019-01-16 | Discharge: 2019-01-16 | Disposition: A | Discharge status: Home / Self Care | Payer: Medicare Other | Source: Ambulatory Visit | Attending: Neurology | Admitting: Neurology

## 2019-01-16 ENCOUNTER — Other Ambulatory Visit: Payer: Self-pay

## 2019-01-16 DIAGNOSIS — R413 Other amnesia: Secondary | ICD-10-CM

## 2019-02-11 ENCOUNTER — Ambulatory Visit: Payer: Medicare Other | Attending: Neurology | Admitting: Speech Pathology

## 2019-02-11 ENCOUNTER — Other Ambulatory Visit: Payer: Self-pay

## 2019-02-11 ENCOUNTER — Encounter: Payer: Self-pay | Admitting: Speech Pathology

## 2019-02-11 DIAGNOSIS — R41841 Cognitive communication deficit: Secondary | ICD-10-CM | POA: Insufficient documentation

## 2019-02-11 NOTE — Addendum Note (Signed)
Addended by: Jay Schlichter on: 02/11/2019 05:41 PM   Modules accepted: Orders

## 2019-02-11 NOTE — Therapy (Signed)
[place] [facility] MAIN [facility] SERVICES [address] [place], [place], [address] Phone: [phone]   Fax:  [phone]  Speech Language Pathology Evaluation  Patient Details  Name: Ashley Kramer MRN: [id number] Date of Birth: May 27, 1931 No data recorded  Encounter Date: 02/11/2019  End of Session - 02/11/19 1713    Visit Number  1    Number of Visits  17    Date for SLP Re-Evaluation  04/13/19    SLP Start Time  1300    SLP Stop Time   1410    SLP Time Calculation (min)  70 min    Activity Tolerance  Patient tolerated treatment well       Past Medical History:  Diagnosis Date  . Anemia   . Diabetes mellitus without complication (HCC)   . Diverticulitis   . Hyperlipidemia     Past Surgical History:  Procedure Laterality Date  . APPENDECTOMY    . BREAST EXCISIONAL BIOPSY    . BREAST SURGERY  2012   BIOPSY   . ESOPHAGOGASTRODUODENOSCOPY  2012  . MEDIAL PARTIAL KNEE REPLACEMENT Right 2012  . TUBAL LIGATION  1974    There were no vitals filed for this visit.  Subjective Assessment - 02/11/19 1315    Subjective  Pt was very pleasant, cooperative and inquisitive throughout evaluation. She arrived stating she really didn't know why Dr. Sherryll Burger sent her here.    Patient is accompained by:  Family member    Currently in Pain?  No/denies         SLP Evaluation [facility] - 02/11/19 1315      SLP Visit Information   SLP Received On  02/11/19    Onset Date  Over the past few months    Medical Diagnosis  Memory Loss      Subjective   Subjective  Pt was very inquisitive and attempted every task to the best of her ability.      General Information   HPI  Pt recently seen by Dr Sherryll Burger for gradual memory loss since early 2020. Pt concerned about dementia as father had dementia. She says is takes longer to think of things. She has a hearing impairment, and is encouraged to wear her hearing aids at all times. She is here seeking cognitive  evaluation to see if she needs treament for her memory and education re: compensatory strategies.    Mobility Status  WFL      Balance Screen   Has the patient fallen in the past 6 months  No    Has the patient had a decrease in activity level because of a fear of falling?   No    Is the patient reluctant to leave their home because of a fear of falling?   No      Prior Functional Status   Cognitive/Linguistic Baseline  Within functional limits     Lives With  Spouse    Available Support  Family    Education  Master degree Elementary Education    Vocation  Retired      IT consultant   Overall Cognitive Status  Impaired/Different from baseline    Area of Impairment  Memory    Memory  Decreased short-term memory      Auditory Comprehension   Overall Auditory Comprehension  Appears within functional limits for tasks assessed      Expression   Primary Mode of Expression  Verbal      Verbal Expression  Overall Verbal Expression  Other (comment)   Appeared WFL except for divergent naming   Repetition  No impairment    Naming  Impairment    Divergent  Other (comment)   on avg named 10 items in 60 seconds     Written Expression   Dominant Hand  Right    Written Expression  Within Functional Limits      Oral Motor/Sensory Function   Overall Oral Motor/Sensory Function  Appears within functional limits for tasks assessed      Motor Speech   Overall Motor Speech  Appears within functional limits for tasks assessed      Standardized Assessments   Standardized Assessments   Other Assessment    Other Assessment  CCAS-Scale        CCAS-Scale (Cerebellar Cognitive Affective Schmahmann Syndrome Scale) Version 1A  Semantic Fluency                   8/26     Fail       Phonemic Fluency                  10/19    Pass                 Category Switching                 10/15     Pass Digit Span Forward                 8/8        Pass Digit Span Backward              6/6         Pass Cube (draw)                             15/15  Pass Verbal Recall                           13/15    Pass Similarities                               7/8        Pass GO NO-GO                             2/2        Pass Affect                                       6/6        Pass  TOTAL SCORE                      85/120             1 Failed Tests       SLP Education - 02/11/19 1712    Education Details  re: role of SLP in cognitive training    Person(s) Educated  Patient;Spouse    Methods  Explanation    Comprehension  Verbalized understanding         SLP Long Term Goals - 02/11/19 1714      SLP LONG TERM GOAL #1   Title  Pt wil name at least 15 items in a named category independently with 100% accuracy.    Time  8    Period  Weeks    Status  New    Target Date  04/13/19      SLP LONG TERM GOAL #2   Title  After listening to a 5 sentence paragraph,  pt will answer y/n and wh questions with 80% accuracy independently.    Time  8    Period  Weeks    Status  New    Target Date  04/13/19      SLP LONG TERM GOAL #3   Title  Pt will use external and internal memory aids and compensatory strategies to aid short term recall  independently.    Time  8    Period  Weeks    Status  New    Target Date  04/13/19       Plan - 02/11/19 1713    Clinical Impression Statement  This very pleasant 85 y/o female presents with mild cognitive linguistic deficits c/b deficits w/generative naming and decreased short term recall of details of newly presented material. Pt has strengths in verbal expression, auditory and reading comprehension, immediate recall of short strings of information. Pt will.benefit from skilled SLP tx for cognitive training to improve functional memory skills and provide pt education re: compensatory strategies to aid short term recall and cognitive organization.   Speech Therapy Frequency  2x / week    Duration  Other (comment)   8 weeks    Treatment/Interventions  Compensatory strategies;SLP instruction and feedback;Functional tasks;Patient/family education;Cognitive reorganization;Internal/external aids;Cueing hierarchy    Potential to Achieve Goals  Good    Potential Considerations  Ability to learn/carryover information;Family/community support;Cooperation/participation level;Previous level of function    SLP Home Exercise Plan  TBD    Consulted and Agree with Plan of Care  Patient;Family member/caregiver    Family Member Consulted  husband       Patient will benefit from skilled therapeutic intervention in order to improve the following deficits and impairments:   Cognitive communication deficit    Problem List Patient Active Problem List   Diagnosis Date Noted  . Acute anxiety 12/31/2015  . Pharyngitis 09/29/2015  . Hypertension 12/03/2014  . TIA (transient ischemic attack) 12/03/2014  . Diabetes (HCC) 11/12/2014  . Hyperlipemia 11/12/2014  . Abnormal mammogram 11/12/2014  . Anemia 11/12/2014  . Diverticulitis 11/12/2014    Jeannette HowNovak,Rohan Juenger, MA, CCC-SLP 02/11/2019, 5:27 PM  [place] [facility] MAIN [facility] [address] [place], [place], [address] Phone: [phone]   Fax:  [phone]  Name: Ashley Kramer MRN: [id number] Date of Birth: 18-Oct-1931

## 2019-02-14 ENCOUNTER — Encounter: Payer: Self-pay | Admitting: Speech Pathology

## 2019-02-14 ENCOUNTER — Other Ambulatory Visit: Payer: Self-pay

## 2019-02-14 ENCOUNTER — Ambulatory Visit: Payer: Medicare Other | Attending: Neurology | Admitting: Speech Pathology

## 2019-02-14 DIAGNOSIS — R41841 Cognitive communication deficit: Secondary | ICD-10-CM | POA: Diagnosis not present

## 2019-02-18 ENCOUNTER — Other Ambulatory Visit: Payer: Self-pay

## 2019-02-18 ENCOUNTER — Ambulatory Visit: Payer: Medicare Other | Admitting: Speech Pathology

## 2019-02-18 ENCOUNTER — Encounter: Payer: Self-pay | Admitting: Speech Pathology

## 2019-02-18 DIAGNOSIS — R41841 Cognitive communication deficit: Secondary | ICD-10-CM | POA: Diagnosis not present

## 2019-02-18 NOTE — Therapy (Signed)
[place] [facility] MAIN [facility] SERVICES [address] [place], [place], [address] Phone: [phone]   Fax:  [phone]  Speech Language Pathology Treatment  Patient Details  Name: Ashley Kramer MRN: [id number] Date of Birth: 1931-11-10 No data recorded  Encounter Date: 02/18/2019  End of Session - 02/18/19 1723    Visit Number  3    Number of Visits  17    Date for SLP Re-Evaluation  04/13/19    Authorization Time Period  2/10 till Medicare progress note    Authorization - Visit Number  2    Authorization - Number of Visits  10    SLP Start Time  1300    SLP Stop Time   1356    SLP Time Calculation (min)  56 min    Activity Tolerance  Patient tolerated treatment well       Past Medical History:  Diagnosis Date  . Anemia   . Diabetes mellitus without complication (HCC)   . Diverticulitis   . Hyperlipidemia     Past Surgical History:  Procedure Laterality Date  . APPENDECTOMY    . BREAST EXCISIONAL BIOPSY    . BREAST SURGERY  2012   BIOPSY   . ESOPHAGOGASTRODUODENOSCOPY  2012  . MEDIAL PARTIAL KNEE REPLACEMENT Right 2012  . TUBAL LIGATION  1974    There were no vitals filed for this visit.  Subjective Assessment - 02/18/19 1324    Subjective  Pt  was alert, cooperative and appeared to enjoy tx tasks, smiling often.    Patient is accompained by:  Family member    Currently in Pain?  No/denies            ADULT SLP TREATMENT - 02/18/19 1325      General Information   Behavior/Cognition  Alert;Cooperative;Pleasant mood    Patient Positioning  Upright in chair    Oral care provided  N/A    HPI  Pt recently seen by Dr Sherryll Burger for gradual memory loss since early 2020. Pt concerned about dementia as father had dementia. She says is takes longer to think of things. She has a hearing impairment, and is encouraged to wear her hearing aids at all times. She is here seeking cognitive evaluation to see if she needs treament for her  memory and education re: compensatory strategies.       Treatment Provided   Treatment provided  Cognitive-Linquistic      Pain Assessment   Pain Assessment  No/denies pain      Cognitive-Linquistic Treatment   Treatment focused on  Cognition    Skilled Treatment  Pt named synonyms and antonyms with 90% accuracy independently.  Pt named 10-15 items in a named category given min to mod verbal cues with 100% accuracy. Pt completed auditory comprehension/delayed recall tasks following hearing a 3-5 sentence paragraph with 90% accuracy given min to mod verbal cues, 100% using self made written notes for key details, 60% accuracy independently.       Assessment / Recommendations / Plan   Plan  Continue with current plan of care      Progression Toward Goals   Progression toward goals  Progressing toward goals       SLP Education - 02/18/19 1723    Education Details  re: compensatory strategies to aid short term recall and word finding    Person(s) Educated  Patient;Spouse    Methods  Explanation;Demonstration;Verbal cues;Handout    Comprehension  Verbalized understanding;Need further instruction;Returned  demonstration;Verbal cues required         SLP Long Term Goals - 02/11/19 1714      SLP LONG TERM GOAL #1   Title  Pt wil name at least 15 items in a named category independently with 100% accuracy.    Time  8    Period  Weeks    Status  New    Target Date  04/13/19      SLP LONG TERM GOAL #2   Title  After listening to a 5 sentence paragraph,  pt will answer y/n and wh questions with 80% accuracy independently.    Time  8    Period  Weeks    Status  New    Target Date  04/13/19      SLP LONG TERM GOAL #3   Title  Pt will use external and internal memory aids and compensatory strategies to aid short term recall  independently.    Time  8    Period  Weeks    Status  New    Target Date  04/13/19       Plan - 02/18/19 1724    Clinical Impression Statement  Improved  ease of word retrieval this session, beginning to note emerging independence using compensatory strategies.  Pt will continue to benefit from skilled ST services to improve functional memory & communication skills.   Speech Therapy Frequency  2x / week    Duration  Other (comment)   8 weeks   Treatment/Interventions  Compensatory strategies;SLP instruction and feedback;Functional tasks;Patient/family education;Cognitive reorganization;Internal/external aids;Cueing hierarchy    Potential to Achieve Goals  Good    Potential Considerations  Ability to learn/carryover information;Family/community support;Cooperation/participation level;Previous level of function    SLP Home Exercise Plan  divergent naming tasks, regular home use of compensatory strategies    Consulted and Agree with Plan of Care  Patient;Family member/caregiver    Family Member Consulted  husband       Patient will benefit from skilled therapeutic intervention in order to improve the following deficits and impairments:   Cognitive communication deficit    Problem List Patient Active Problem List   Diagnosis Date Noted  . Acute anxiety 12/31/2015  . Pharyngitis 09/29/2015  . Hypertension 12/03/2014  . TIA (transient ischemic attack) 12/03/2014  . Diabetes ([place]) 11/12/2014  . Hyperlipemia 11/12/2014  . Abnormal mammogram 11/12/2014  . Anemia 11/12/2014  . Diverticulitis 11/12/2014    Rulon Eisenmenger, MA, CCC-SLP 02/18/2019, 5:33 PM  [place] MAIN [facility] SERVICES [address] [place], [place], [address] Phone: [phone]   Fax:  [phone]   Name: Ashley Kramer MRN: [id number] Date of Birth: 11/01/1931

## 2019-02-18 NOTE — Therapy (Signed)
[facility] MAIN [facility] SERVICES [address] [place], [place], [address] Phone: [phone]   Fax:  [phone]  Speech Language Pathology Treatment  Patient Details  Name: Ashley Kramer MRN: [id number] Date of Birth: 1931-11-16 No data recorded  Encounter Date: 02/14/2019  End of Session - 02/18/19 0816    Visit Number  2    Number of Visits  17    Date for SLP Re-Evaluation  04/13/19    Authorization Time Period  1/10 till Medicare progress note    SLP Start Time  1300    SLP Stop Time   1352    SLP Time Calculation (min)  52 min    Activity Tolerance  Patient tolerated treatment well       Past Medical History:  Diagnosis Date  . Anemia   . Diabetes mellitus without complication (HCC)   . Diverticulitis   . Hyperlipidemia     Past Surgical History:  Procedure Laterality Date  . APPENDECTOMY    . BREAST EXCISIONAL BIOPSY    . BREAST SURGERY  2012   BIOPSY   . ESOPHAGOGASTRODUODENOSCOPY  2012  . MEDIAL PARTIAL KNEE REPLACEMENT Right 2012  . TUBAL LIGATION  1974    There were no vitals filed for this visit.         ADULT SLP TREATMENT - 02/18/19 0001      General Information   Behavior/Cognition  Alert;Cooperative;Pleasant mood    Patient Positioning  Upright in chair    Oral care provided  N/A    HPI  Pt recently seen by Dr Sherryll Burger for gradual memory loss since early 2020. Pt concerned about dementia as father had dementia. She says is takes longer to think of things. She has a hearing impairment, and is encouraged to wear her hearing aids at all times. She is here seeking cognitive evaluation to see if she needs treament for her memory and education re: compensatory strategies.       Treatment Provided   Treatment provided  Cognitive-Linquistic      Pain Assessment   Pain Assessment  No/denies pain      Cognitive-Linquistic Treatment   Treatment focused on  Cognition    Skilled Treatment   Pt named 10-15  items in a named category given min to mod verbal cues with 100% accuracy, named 8 items independently. Pt completed auditory comprehension/delayed recall tasks following hearing a 3-5 sentence paragraph with 90% accuracy given min to mod verbal cues, 60% accuracy independently.       Assessment / Recommendations / Plan   Plan  Continue with current plan of care      Progression Toward Goals   Progression toward goals  Progressing toward goals       SLP Education - 02/18/19 0814    Education Details  re: compensatory strategies to aid short term recall and word finding    Person(s) Educated  Patient;Spouse    Methods  Explanation;Demonstration;Verbal cues;Handout    Comprehension  Verbalized understanding;Need further instruction;Returned demonstration;Verbal cues required         SLP Long Term Goals - 02/11/19 1714      SLP LONG TERM GOAL #1   Title  Pt wil name at least 15 items in a named category independently with 100% accuracy.    Time  8    Period  Weeks    Status  New    Target Date  04/13/19      SLP  LONG TERM GOAL #2   Title  After listening to a 5 sentence paragraph,  pt will answer y/n and wh questions with 80% accuracy independently.    Time  8    Period  Weeks    Status  New    Target Date  04/13/19      SLP LONG TERM GOAL #3   Title  Pt will use external and internal memory aids and compensatory strategies to aid short term recall  independently.    Time  8    Period  Weeks    Status  New    Target Date  04/13/19       Plan - 02/18/19 0817    Clinical Impression Statement  Education re: compensatory strategies to improve word finding and cognitive organization improved accuracy on word finding tasks. Pt very motivated to participate in regular cognitive training to remediate deficits and prevent further cognitive decline. Pt will continue to benefit from skilled ST services to improve functional memory & communication skills.   Speech Therapy Frequency   2x / week    Duration  Other (comment)   8 weeks   Treatment/Interventions  Compensatory strategies;SLP instruction and feedback;Functional tasks;Patient/family education;Cognitive reorganization;Internal/external aids;Cueing hierarchy    Potential to Achieve Goals  Good    Potential Considerations  Ability to learn/carryover information;Family/community support;Cooperation/participation level;Previous level of function    Consulted and Agree with Plan of Care  Patient;Family member/caregiver    Family Member Consulted  husband       Patient will benefit from skilled therapeutic intervention in order to improve the following deficits and impairments:   Cognitive communication deficit    Problem List Patient Active Problem List   Diagnosis Date Noted  . Acute anxiety 12/31/2015  . Pharyngitis 09/29/2015  . Hypertension 12/03/2014  . TIA (transient ischemic attack) 12/03/2014  . Diabetes ([place]) 11/12/2014  . Hyperlipemia 11/12/2014  . Abnormal mammogram 11/12/2014  . Anemia 11/12/2014  . Diverticulitis 11/12/2014    Rulon Eisenmenger, MA, CCC-SLP 02/18/2019, 8:25 AM  [place] MAIN [facility] SERVICES [address] [place], [place], [address] Phone: [phone]   Fax:  [phone]   Name: Ashley Kramer MRN: [id number] Date of Birth: Jan 01, 1932

## 2019-02-21 ENCOUNTER — Ambulatory Visit: Payer: Medicare Other | Admitting: Speech Pathology

## 2019-02-21 ENCOUNTER — Other Ambulatory Visit: Payer: Self-pay

## 2019-02-21 ENCOUNTER — Encounter: Payer: Self-pay | Admitting: Speech Pathology

## 2019-02-21 DIAGNOSIS — R41841 Cognitive communication deficit: Secondary | ICD-10-CM | POA: Diagnosis not present

## 2019-02-21 NOTE — Therapy (Signed)
[place] MAIN [facility] SERVICES [address] [place], [place], [address] Phone: [phone]   Fax:  [phone]  Speech Language Pathology Treatment  Patient Details  Name: Ashley Kramer MRN: [id number] Date of Birth: [birth year] No data recorded  Encounter Date: 02/21/2019  End of Session - 02/21/19 1728    Visit Number  4    Number of Visits  17    Date for SLP Re-Evaluation  04/13/19    Authorization Time Period  3/10 till Medicare progress note    Authorization - Visit Number  3    Authorization - Number of Visits  10    SLP Start Time  1300    SLP Stop Time   1356    SLP Time Calculation (min)  56 min    Activity Tolerance  Patient tolerated treatment well       Past Medical History:  Diagnosis Date  . Anemia   . Diabetes mellitus without complication ([place])   . Diverticulitis   . Hyperlipidemia     Past Surgical History:  Procedure Laterality Date  . APPENDECTOMY    . BREAST EXCISIONAL BIOPSY    . BREAST SURGERY  2012   BIOPSY   . ESOPHAGOGASTRODUODENOSCOPY  2012  . MEDIAL PARTIAL KNEE REPLACEMENT Right 2012  . TUBAL LIGATION  1974    There were no vitals filed for this visit.  Subjective Assessment - 02/21/19 1324    Subjective  Pt  was alert, cooperative and appeared to enjoy tx tasks, smiling often.    Currently in Pain?  No/denies            ADULT SLP TREATMENT - 02/21/19 0001      General Information   Behavior/Cognition  Alert;Cooperative;Pleasant mood    Patient Positioning  Upright in chair    Oral care provided  N/A    HPI  Pt recently seen by Dr Manuella Ghazi for gradual memory loss since early 2020. Pt concerned about dementia as father had dementia. She says is takes longer to think of things. She has a hearing impairment, and is encouraged to wear her hearing aids at all times. She is here seeking cognitive evaluation to see if she needs treament for her memory and education re: compensatory strategies.        Treatment Provided   Treatment provided  Cognitive-Linquistic      Pain Assessment   Pain Assessment  No/denies pain      Cognitive-Linquistic Treatment   Treatment focused on  Cognition    Skilled Treatment   Pt named 10-15 items in a named category given min to mod verbal cues with 100% accuracy. Pt completed visual memory tasks with 100% accuracy given min cues. Pt verbally identified short sentences "storytelling" to aid short term recall of 3-5 words with 80% accuracy given min to mod verbal cues.  Pt named 5 compensatory strategies to aid short term recall with 100% accuracy given min cues.      Assessment / Recommendations / Plan   Plan  Continue with current plan of care      Progression Toward Goals   Progression toward goals  Progressing toward goals       SLP Education - 02/21/19 1728    Education Details  re: compensatory strategies to aid short term recall and word finding    Person(s) Educated  Patient;Spouse    Methods  Explanation;Demonstration;Verbal cues;Handout    Comprehension  Verbalized understanding;Need further instruction;Returned demonstration;Verbal cues  required         SLP Long Term Goals - 02/11/19 1714      SLP LONG TERM GOAL #1   Title  Pt wil name at least 15 items in a named category independently with 100% accuracy.    Time  8    Period  Weeks    Status  New    Target Date  04/13/19      SLP LONG TERM GOAL #2   Title  After listening to a 5 sentence paragraph,  pt will answer y/n and wh questions with 80% accuracy independently.    Time  8    Period  Weeks    Status  New    Target Date  04/13/19      SLP LONG TERM GOAL #3   Title  Pt will use external and internal memory aids and compensatory strategies to aid short term recall  independently.    Time  8    Period  Weeks    Status  New    Target Date  04/13/19       Plan - 02/21/19 1729    Clinical Impression Statement  Pt demonstrated emerging independence using  compensatory strategies to aid word finding and short term recall.  Pt will continue to benefit from skilled ST services to improve functional memory and communication skills.   Speech Therapy Frequency  2x / week    Duration  Other (comment)   8 weeks   Treatment/Interventions  Compensatory strategies;SLP instruction and feedback;Functional tasks;Patient/family education;Cognitive reorganization;Internal/external aids;Cueing hierarchy    Potential to Achieve Goals  Good    Potential Considerations  Ability to learn/carryover information;Family/community support;Cooperation/participation level;Previous level of function    SLP Home Exercise Plan  divergent naming, short term memory tasks, regular home use of compensatory strategies    Consulted and Agree with Plan of Care  Patient;Family member/caregiver    Family Member Consulted  husband       Patient will benefit from skilled therapeutic intervention in order to improve the following deficits and impairments:   Cognitive communication deficit    Problem List Patient Active Problem List   Diagnosis Date Noted  . Acute anxiety 12/31/2015  . Pharyngitis 09/29/2015  . Hypertension 12/03/2014  . TIA (transient ischemic attack) 12/03/2014  . Diabetes (HCC) 11/12/2014  . Hyperlipemia 11/12/2014  . Abnormal mammogram 11/12/2014  . Anemia 11/12/2014  . Diverticulitis 11/12/2014    Vangie Henthorn, MA, CCC-SLP 02/21/2019, 5:35 PM  [place] [facility] MAIN [facility] SERVICES [address] [place], [place], [address] Phone: [phone]   Fax:  [phone]   Name: Ashley Kramer MRN: [id number] Date of Birth: 1931/11/26

## 2019-02-25 ENCOUNTER — Other Ambulatory Visit: Payer: Self-pay

## 2019-02-25 ENCOUNTER — Encounter: Payer: Self-pay | Admitting: Speech Pathology

## 2019-02-25 ENCOUNTER — Ambulatory Visit: Payer: Medicare Other | Admitting: Speech Pathology

## 2019-02-25 DIAGNOSIS — R41841 Cognitive communication deficit: Secondary | ICD-10-CM

## 2019-02-25 NOTE — Therapy (Signed)
[place] MAIN [facility] SERVICES [address] [place], [place], [address] Phone: [phone]   Fax:  [phone]  Speech Language Pathology Treatment  Patient Details  Name: Ashley Kramer MRN: [id number] Date of Birth: 24-Aug-1931 No data recorded  Encounter Date: 02/25/2019  End of Session - 02/25/19 1653    Visit Number  5    Number of Visits  17    Date for SLP Re-Evaluation  04/13/19    Authorization - Visit Number  4    Authorization - Number of Visits  10    SLP Start Time  1300    SLP Stop Time   7782    SLP Time Calculation (min)  59 min    Activity Tolerance  Patient tolerated treatment well       Past Medical History:  Diagnosis Date  . Anemia   . Diabetes mellitus without complication ([place])   . Diverticulitis   . Hyperlipidemia     Past Surgical History:  Procedure Laterality Date  . APPENDECTOMY    . BREAST EXCISIONAL BIOPSY    . BREAST SURGERY  2012   BIOPSY   . ESOPHAGOGASTRODUODENOSCOPY  2012  . MEDIAL PARTIAL KNEE REPLACEMENT Right 2012  . TUBAL LIGATION  1974    There were no vitals filed for this visit.  Subjective Assessment - 02/25/19 1312    Subjective  Pt  was alert, cooperative and appeared to enjoy tx tasks, smiling often.    Patient is accompained by:  Family member    Currently in Pain?  No/denies            ADULT SLP TREATMENT - 02/25/19 0001      General Information   Behavior/Cognition  Alert;Cooperative;Pleasant mood    Patient Positioning  Upright in chair    Oral care provided  N/A    HPI  Pt recently seen by Dr Manuella Ghazi for gradual memory loss since early 2020. Pt concerned about dementia as father had dementia. She says is takes longer to think of things. She has a hearing impairment, and is encouraged to wear her hearing aids at all times. She is here seeking cognitive evaluation to see if she needs treament for her memory and education re: compensatory strategies.       Treatment Provided   Treatment provided  Cognitive-Linquistic      Pain Assessment   Pain Assessment  No/denies pain      Cognitive-Linquistic Treatment   Treatment focused on  Cognition    Skilled Treatment  Pt read mod complex paragraphs and recalled facts to answer y/n & wh questions with 100% accuracy using verbal rehearsal as needed.  Pt read complex paragraphs and recalled facts to answer y/n & wh questions with 80% accuracy using verbal rehearsal as needed. Pt completed visual memory tasks with 75% accuracy given min cues.      Assessment / Recommendations / Plan   Plan  Continue with current plan of care      Progression Toward Goals   Progression toward goals  Progressing toward goals       SLP Education - 02/25/19 1652    Education Details  re: compensatory strategies to aid short term recall and word finding    Person(s) Educated  Patient;Spouse    Methods  Explanation;Demonstration;Verbal cues    Comprehension  Verbalized understanding;Need further instruction;Returned demonstration;Verbal cues required         SLP Long Term Goals - 02/11/19 1714  SLP LONG TERM GOAL #1   Title  Pt wil name at least 15 items in a named category independently with 100% accuracy.    Time  8    Period  Weeks    Status  New    Target Date  04/13/19      SLP LONG TERM GOAL #2   Title  After listening to a 5 sentence paragraph,  pt will answer y/n and wh questions with 80% accuracy independently.    Time  8    Period  Weeks    Status  New    Target Date  04/13/19      SLP LONG TERM GOAL #3   Title  Pt will use external and internal memory aids and compensatory strategies to aid short term recall  independently.    Time  8    Period  Weeks    Status  New    Target Date  04/13/19       Plan - 02/25/19 1653    Clinical Impression Statement  Pt demonstrated improved working memory and recall with complex reading passages this session. Pt will continue to benefit from  skilled ST services to improve functional memory & communication skills.   Speech Therapy Frequency  2x / week    Duration  Other (comment)   8 weeks   Treatment/Interventions  Compensatory strategies;SLP instruction and feedback;Functional tasks;Patient/family education;Cognitive reorganization;Internal/external aids;Cueing hierarchy    Potential to Achieve Goals  Good    Potential Considerations  Ability to learn/carryover information;Family/community support;Cooperation/participation level;Previous level of function    SLP Home Exercise Plan  divergent naming, short term memory tasks, regular home use of compensatory strategies    Consulted and Agree with Plan of Care  Patient;Family member/caregiver    Family Member Consulted  husband       Patient will benefit from skilled therapeutic intervention in order to improve the following deficits and impairments:   Cognitive communication deficit    Problem List Patient Active Problem List   Diagnosis Date Noted  . Acute anxiety 12/31/2015  . Pharyngitis 09/29/2015  . Hypertension 12/03/2014  . TIA (transient ischemic attack) 12/03/2014  . Diabetes (HCC) 11/12/2014  . Hyperlipemia 11/12/2014  . Abnormal mammogram 11/12/2014  . Anemia 11/12/2014  . Diverticulitis 11/12/2014    Reo Portela, MA, CCC-SLP 02/25/2019, 5:01 PM  [place] [facility] MAIN [facility] SERVICES [address] [place], [place], [address] Phone: [phone]   Fax:  [phone]   Name: Ashley Kramer MRN: [id number] Date of Birth: [birth year]

## 2019-02-28 ENCOUNTER — Ambulatory Visit: Payer: Medicare Other | Admitting: Speech Pathology

## 2019-03-04 ENCOUNTER — Other Ambulatory Visit: Payer: Self-pay

## 2019-03-04 ENCOUNTER — Ambulatory Visit: Payer: Medicare Other | Admitting: Speech Pathology

## 2019-03-04 DIAGNOSIS — R41841 Cognitive communication deficit: Secondary | ICD-10-CM

## 2019-03-04 NOTE — Therapy (Signed)
[place] [facility] MAIN [facility] SERVICES [address] [place], [place], [address] Phone: [phone]   Fax:  [phone]  Speech Language Pathology Treatment  Patient Details  Name: Ashley Kramer MRN: [id number] Date of Birth: 1931-08-05 No data recorded  Encounter Date: 03/04/2019  End of Session - 03/04/19 1414    Visit Number  6    Number of Visits  17    Date for SLP Re-Evaluation  04/13/19    Authorization Time Period  5/10 till Medicare progress note    Authorization - Visit Number  5    Authorization - Number of Visits  10    SLP Start Time  1300    SLP Stop Time   1355    SLP Time Calculation (min)  55 min    Activity Tolerance  Patient tolerated treatment well       Past Medical History:  Diagnosis Date  . Anemia   . Diabetes mellitus without complication (HCC)   . Diverticulitis   . Hyperlipidemia     Past Surgical History:  Procedure Laterality Date  . APPENDECTOMY    . BREAST EXCISIONAL BIOPSY    . BREAST SURGERY  2012   BIOPSY   . ESOPHAGOGASTRODUODENOSCOPY  2012  . MEDIAL PARTIAL KNEE REPLACEMENT Right 2012  . TUBAL LIGATION  1974    There were no vitals filed for this visit.         ADULT SLP TREATMENT - 03/04/19 1300      General Information   Behavior/Cognition  Alert;Cooperative;Pleasant mood    HPI  Pt recently seen by Dr Sherryll Burger for gradual memory loss since early 2020. Pt concerned about dementia as father had dementia. She says is takes longer to think of things. She has a hearing impairment, and is encouraged to wear her hearing aids at all times. She is here seeking cognitive evaluation to see if she needs treament for her memory and education re: compensatory strategies.       Treatment Provided   Treatment provided  Cognitive-Linquistic      Pain Assessment   Pain Assessment  No/denies pain      Cognitive-Linquistic Treatment   Treatment focused on  Cognition;Patient/family/caregiver education     Skilled Treatment   SLP reviewed pt's homework with her; she required min-mod verbal cues to ID 2 questions overlooked on her assignment. Pt read mod complex paragraphs and recalled facts to answer y/n & wh questions with 90% accuracy (100% accuracy with min verbal cue for attention to detail, re-reading). Min-mod complex visual memory tasks 95% accuracy; with more complex tasks (20 unrelated words), accuracy decreased to 60%. Afterwards, pt ID'd strategies she could use to improve accuracy, including categorization, association, and mental imaging. Suggested pt could use these strategies when making trip to the grocery store, and challenge herself to remember a set # of items from her list and try to recall in the store. Pt listened to 5-6 sentence news articles and answered multiple choice questions 88% accuracy.       Assessment / Recommendations / Plan   Plan  Continue with current plan of care      Progression Toward Goals   Progression toward goals  Progressing toward goals       SLP Education - 03/04/19 1414    Education Details  memory strategies and compensations, functional activities to practice recall    Person(s) Educated  Patient;Spouse    Methods  Explanation;Demonstration;Verbal cues  Comprehension  Verbalized understanding;Need further instruction;Returned demonstration;Verbal cues required         SLP Long Term Goals - 02/11/19 1714      SLP LONG TERM GOAL #1   Title  Pt wil name at least 15 items in a named category independently with 100% accuracy.    Time  8    Period  Weeks    Status  New    Target Date  04/13/19      SLP LONG TERM GOAL #2   Title  After listening to a 5 sentence paragraph,  pt will answer y/n and wh questions with 80% accuracy independently.    Time  8    Period  Weeks    Status  New    Target Date  04/13/19      SLP LONG TERM GOAL #3   Title  Pt will use external and internal memory aids and compensatory strategies to aid short term  recall  independently.    Time  8    Period  Weeks    Status  New    Target Date  04/13/19       Plan - 03/04/19 1415    Clinical Impression Statement  Patient reports using compensations such as calendar, written notes to improve recall at home. With complex reading, min cues for attention to detail. Patient receptive to SLP feedback re: strategies to improve function with memory. Pt will continue to benefit from skilled ST services to improve functional memory & communication skills.    Speech Therapy Frequency  2x / week    Duration  Other (comment)   8 weeks   Treatment/Interventions  Compensatory strategies;SLP instruction and feedback;Functional tasks;Patient/family education;Cognitive reorganization;Internal/external aids;Cueing hierarchy    Potential to Achieve Goals  Good    Potential Considerations  Ability to learn/carryover information;Family/community support;Cooperation/participation level;Previous level of function    SLP Home Exercise Plan  divergent naming, short term memory tasks, regular home use of compensatory strategies    Consulted and Agree with Plan of Care  Patient;Family member/caregiver    Family Member Consulted  husband       Patient will benefit from skilled therapeutic intervention in order to improve the following deficits and impairments:   Cognitive communication deficit    Problem List Patient Active Problem List   Diagnosis Date Noted  . Acute anxiety 12/31/2015  . Pharyngitis 09/29/2015  . Hypertension 12/03/2014  . TIA (transient ischemic attack) 12/03/2014  . Diabetes ([place]) 11/12/2014  . Hyperlipemia 11/12/2014  . Abnormal mammogram 11/12/2014  . Anemia 11/12/2014  . Diverticulitis 11/12/2014   Deneise Lever, [place], [place] 03/04/2019, 2:18 PM  [place] MAIN [facility] SERVICES [address] [place], [place], [address] Phone: [phone]   Fax:   [phone]   Name: JULENE RAHN MRN: [id number] Date of Birth: 25-Jan-1932

## 2019-03-07 ENCOUNTER — Ambulatory Visit: Payer: Medicare Other | Admitting: Speech Pathology

## 2019-03-07 ENCOUNTER — Other Ambulatory Visit: Payer: Self-pay

## 2019-03-07 ENCOUNTER — Encounter: Payer: Self-pay | Admitting: Speech Pathology

## 2019-03-07 DIAGNOSIS — R41841 Cognitive communication deficit: Secondary | ICD-10-CM

## 2019-03-07 NOTE — Therapy (Signed)
[place] MAIN [facility] SERVICES [address] [place], [place], [address] Phone: [phone]   Fax:  [phone]  Speech Language Pathology Treatment  Patient Details  Name: Ashley Kramer MRN: [id number] Date of Birth: 02/28/1932 No data recorded  Encounter Date: 03/07/2019  End of Session - 03/07/19 1525    Visit Number  7    Number of Visits  17    Date for SLP Re-Evaluation  04/13/19    Authorization Time Period  5/10 till Medicare progress note    Authorization - Visit Number  6    Authorization - Number of Visits  10    SLP Start Time  1401    SLP Stop Time   1501    SLP Time Calculation (min)  60 min    Activity Tolerance  Patient tolerated treatment well       Past Medical History:  Diagnosis Date  . Anemia   . Diabetes mellitus without complication ([place])   . Diverticulitis   . Hyperlipidemia     Past Surgical History:  Procedure Laterality Date  . APPENDECTOMY    . BREAST EXCISIONAL BIOPSY    . BREAST SURGERY  2012   BIOPSY   . ESOPHAGOGASTRODUODENOSCOPY  2012  . MEDIAL PARTIAL KNEE REPLACEMENT Right 2012  . TUBAL LIGATION  1974    There were no vitals filed for this visit.  Subjective Assessment - 03/07/19 1515    Subjective  Pt  was alert, cooperative and appeared to enjoy tx tasks, smiling often.    Currently in Pain?  No/denies            ADULT SLP TREATMENT - 03/07/19 0001      General Information   Behavior/Cognition  Alert;Cooperative;Pleasant mood    Patient Positioning  Upright in chair    Oral care provided  N/A    HPI  Pt recently seen by Dr Manuella Ghazi for gradual memory loss since early 2020. Pt concerned about dementia as father had dementia. She says is takes longer to think of things. She has a hearing impairment, and is encouraged to wear her hearing aids at all times. She is here seeking cognitive evaluation to see if she needs treament for her memory and education re: compensatory  strategies.       Treatment Provided   Treatment provided  Cognitive-Linquistic      Pain Assessment   Pain Assessment  No/denies pain      Cognitive-Linquistic Treatment   Treatment focused on  Cognition;Patient/family/caregiver education    Skilled Treatment Pt named 15 items in a named category with 100% accuracy given min to mod verbal cues to use compensatory strategies. SLP provided extensive education re: everyday application and practice of compensatory strategies to improve short term recall of relevant details from short reading passages and small 10 minute segments of TV programs. Pt restated 5 strategies to aid short term recall x3 given min cues for accuracy. Discussed need for consistency with home practice of strategies with every day reading for improved short term recall.     Assessment / Recommendations / Plan   Plan  Continue with current plan of care      Progression Toward Goals   Progression toward goals  Progressing toward goals       SLP Education - 03/07/19 1521    Education Details  re: strategies to aid short term recall, word finding    Person(s) Educated  Patient    Methods  Explanation;Demonstration;Verbal cues    Comprehension  Verbalized understanding;Returned demonstration;Verbal cues required;Need further instruction         SLP Long Term Goals - 02/11/19 1714      SLP LONG TERM GOAL #1   Title  Pt wil name at least 15 items in a named category independently with 100% accuracy.    Time  8    Period  Weeks    Status  New    Target Date  04/13/19      SLP LONG TERM GOAL #2   Title  After listening to a 5 sentence paragraph,  pt will answer y/n and wh questions with 80% accuracy independently.    Time  8    Period  Weeks    Status  New    Target Date  04/13/19      SLP LONG TERM GOAL #3   Title  Pt will use external and internal memory aids and compensatory strategies to aid short term recall  independently.    Time  8    Period  Weeks     Status  New    Target Date  04/13/19       Plan - 03/07/19 1525    Clinical Impression Statement  Pt demonstrated consistent understanding and use of compensatory strategies to aid word finding and short term recall. SLP provided encouragement to commit to a tangible consistent daily practice of strategies in home environment to aid functional carryover, pt stated understanding and agreement. Pt will continue to benefit from skilled ST services to improve functional memory & communication skills.   Speech Therapy Frequency  2x / week    Duration  Other (comment)   8 weeks   Treatment/Interventions  Compensatory strategies;SLP instruction and feedback;Functional tasks;Patient/family education;Cognitive reorganization;Internal/external aids;Cueing hierarchy    Potential to Achieve Goals  Good    Potential Considerations  Ability to learn/carryover information;Family/community support;Cooperation/participation level;Previous level of function    SLP Home Exercise Plan  divergent naming, short term memory tasks, regular home use of compensatory strategies    Consulted and Agree with Plan of Care  Patient;Family member/caregiver    Family Member Consulted  husband       Patient will benefit from skilled therapeutic intervention in order to improve the following deficits and impairments:   Cognitive communication deficit    Problem List Patient Active Problem List   Diagnosis Date Noted  . Acute anxiety 12/31/2015  . Pharyngitis 09/29/2015  . Hypertension 12/03/2014  . TIA (transient ischemic attack) 12/03/2014  . Diabetes (HCC) 11/12/2014  . Hyperlipemia 11/12/2014  . Abnormal mammogram 11/12/2014  . Anemia 11/12/2014  . Diverticulitis 11/12/2014    Tyreak Reagle, MA, CCC-SLP 03/07/2019, 3:36 PM   [place] [facility] MAIN [facility] SERVICES [address] [place], [place], [address] Phone: [phone]   Fax:  [phone]   Name: Ashley Kramer MRN: [id number] Date of Birth: Sep 21, 1931

## 2019-03-11 ENCOUNTER — Ambulatory Visit: Payer: Medicare Other | Admitting: Speech Pathology

## 2019-03-11 ENCOUNTER — Other Ambulatory Visit: Payer: Self-pay

## 2019-03-11 ENCOUNTER — Encounter: Payer: Self-pay | Admitting: Speech Pathology

## 2019-03-11 DIAGNOSIS — R41841 Cognitive communication deficit: Secondary | ICD-10-CM

## 2019-03-14 ENCOUNTER — Encounter: Payer: Self-pay | Admitting: Speech Pathology

## 2019-03-14 ENCOUNTER — Other Ambulatory Visit: Payer: Self-pay

## 2019-03-14 ENCOUNTER — Ambulatory Visit: Payer: Medicare Other | Admitting: Speech Pathology

## 2019-03-14 DIAGNOSIS — R41841 Cognitive communication deficit: Secondary | ICD-10-CM

## 2019-03-14 NOTE — Therapy (Signed)
[place] MAIN [facility] SERVICES [address] [place], [place], [address] Phone: [phone]   Fax:  [phone]  Speech Language Pathology Treatment  Patient Details  Name: Ashley Kramer MRN: [id number] Date of Birth: [birth year] No data recorded  Encounter Date: 03/14/2019  End of Session - 03/14/19 1526    Visit Number  9    Number of Visits  17    Date for SLP Re-Evaluation  04/13/19    Authorization - Visit Number  8    Authorization - Number of Visits  10    SLP Start Time  2423    SLP Stop Time   5361    SLP Time Calculation (min)  45 min    Activity Tolerance  Patient tolerated treatment well       Past Medical History:  Diagnosis Date  . Anemia   . Diabetes mellitus without complication ([place])   . Diverticulitis   . Hyperlipidemia     Past Surgical History:  Procedure Laterality Date  . APPENDECTOMY    . BREAST EXCISIONAL BIOPSY    . BREAST SURGERY  2012   BIOPSY   . ESOPHAGOGASTRODUODENOSCOPY  2012  . MEDIAL PARTIAL KNEE REPLACEMENT Right 2012  . TUBAL LIGATION  1974    There were no vitals filed for this visit.  Subjective Assessment - 03/14/19 1313    Subjective  Pt  was alert, cooperative and appeared to enjoy tx tasks, smiling often.    Currently in Pain?  No/denies            ADULT SLP TREATMENT - 03/14/19 1355      General Information   Behavior/Cognition  Alert;Cooperative;Pleasant mood    Patient Positioning  Upright in chair    Oral care provided  N/A    HPI  Pt recently seen by Dr Manuella Ghazi for gradual memory loss since early 2020. Pt concerned about dementia as father had dementia. She says is takes longer to think of things. She has a hearing impairment, and is encouraged to wear her hearing aids at all times. She is here seeking cognitive evaluation to see if she needs treament for her memory and education re: compensatory strategies.       Treatment Provided   Treatment provided   Cognitive-Linquistic      Pain Assessment   Pain Assessment  No/denies pain      Cognitive-Linquistic Treatment   Treatment focused on  Cognition;Patient/family/caregiver education    Skilled Treatment   Pt named items in a named category with 100% accuracy given min verbal cues for use of compensatory strategies.  Pt completed auditory comprehension/delayed recall tasks following hearing a 3-5 sentence paragraph with 95% using self made written notes. .     Assessment / Recommendations / Plan   Plan  Continue with current plan of care      Progression Toward Goals   Progression toward goals  Progressing toward goals       SLP Education - 03/14/19 1355    Education Details  re: compensatory strategies to aid short term recall, word finding    Person(s) Educated  Patient    Methods  Explanation;Demonstration;Verbal cues;Handout    Comprehension  Verbalized understanding;Need further instruction;Returned demonstration;Verbal cues required         SLP Long Term Goals - 02/11/19 1714      SLP LONG TERM GOAL #1   Title  Pt wil name at least 15 items in a named category  independently with 100% accuracy.    Time  8    Period  Weeks    Status  New    Target Date  04/13/19      SLP LONG TERM GOAL #2   Title  After listening to a 5 sentence paragraph,  pt will answer y/n and wh questions with 80% accuracy independently.    Time  8    Period  Weeks    Status  New    Target Date  04/13/19      SLP LONG TERM GOAL #3   Title  Pt will use external and internal memory aids and compensatory strategies to aid short term recall  independently.    Time  8    Period  Weeks    Status  New    Target Date  04/13/19       Plan - 03/14/19 1527    Clinical Impression Statement  Improved ease of word retrieval this session, beginning to note emerging independence using compensatory strategies.  Pt will continue to benefit from skilled ST services to improve functional memory &  communication skills   Speech Therapy Frequency  2x / week    Duration  Other (comment)   8 weeks   Treatment/Interventions  Compensatory strategies;SLP instruction and feedback;Functional tasks;Patient/family education;Cognitive reorganization;Internal/external aids;Cueing hierarchy    Potential to Achieve Goals  Good    Potential Considerations  Ability to learn/carryover information;Family/community support;Cooperation/participation level;Previous level of function    SLP Home Exercise Plan  divergent naming, short term memory tasks, regular home use of compensatory strategies    Consulted and Agree with Plan of Care  Patient       Patient will benefit from skilled therapeutic intervention in order to improve the following deficits and impairments:   Cognitive communication deficit    Problem List Patient Active Problem List   Diagnosis Date Noted  . Acute anxiety 12/31/2015  . Pharyngitis 09/29/2015  . Hypertension 12/03/2014  . TIA (transient ischemic attack) 12/03/2014  . Diabetes (HCC) 11/12/2014  . Hyperlipemia 11/12/2014  . Abnormal mammogram 11/12/2014  . Anemia 11/12/2014  . Diverticulitis 11/12/2014    Calin Fantroy, MA, CCC-SLP 03/14/2019, 3:43 PM  [place] [facility] MAIN [facility] SERVICES [address] [place], [place], [address] Phone: [phone]   Fax:  [phone]   Name: DOROTHYE BERNI MRN: [id number] Date of Birth: [birth year]

## 2019-03-14 NOTE — Therapy (Signed)
[place] [facility] MAIN [facility] SERVICES [address] [place], [place], [address] Phone: [phone]   Fax:  [phone]  Speech Language Pathology Treatment  Patient Details  Name: [name] MRN: [id number] Date of Birth: 04/10/1932 No data recorded  Encounter Date: 03/11/2019  End of Session - 03/14/19 0820    Visit Number  8    Number of Visits  17    Date for SLP Re-Evaluation  04/13/19    Authorization - Visit Number  7    Authorization - Number of Visits  10    SLP Start Time  1302    SLP Stop Time   1400    SLP Time Calculation (min)  58 min    Activity Tolerance  Patient tolerated treatment well       Past Medical History:  Diagnosis Date  . Anemia   . Diabetes mellitus without complication (HCC)   . Diverticulitis   . Hyperlipidemia     Past Surgical History:  Procedure Laterality Date  . APPENDECTOMY    . BREAST EXCISIONAL BIOPSY    . BREAST SURGERY  2012   BIOPSY   . ESOPHAGOGASTRODUODENOSCOPY  2012  . MEDIAL PARTIAL KNEE REPLACEMENT Right 2012  . TUBAL LIGATION  1974    There were no vitals filed for this visit.         ADULT SLP TREATMENT - 03/14/19 0001      General Information   Behavior/Cognition  Alert;Cooperative;Pleasant mood    Patient Positioning  Upright in chair    Oral care provided  N/A    HPI  Pt recently seen by Dr Sherryll Burger for gradual memory loss since early 2020. Pt concerned about dementia as father had dementia. She says is takes longer to think of things. She has a hearing impairment, and is encouraged to wear her hearing aids at all times. She is here seeking cognitive evaluation to see if she needs treament for her memory and education re: compensatory strategies.       Treatment Provided   Treatment provided  Cognitive-Linquistic      Pain Assessment   Pain Assessment  No/denies pain      Cognitive-Linquistic Treatment   Treatment focused on  Cognition;Patient/family/caregiver  education    Skilled Treatment Pt named items in a named category with 100% accuracy given min to mod verbal cues for use of compensatory strategies. SLP provided extensive education re: everyday application and practice of compensatory strategies to improve short term recall of relevant details from short reading passages and small 10 minute segments of TV programs. Pt restated 5 strategies to aid short term recall x3 given min cues for accuracy. Discussed need for consistency with home practice of strategies with every day reading for improved short term recall.  .     Assessment / Recommendations / Plan   Plan  Continue with current plan of care      Progression Toward Goals   Progression toward goals  Progressing toward goals       SLP Education - 03/14/19 0819    Education Details  re: strategies to aid short term recall, word finding    Person(s) Educated  Patient    Methods  Explanation;Demonstration;Verbal cues    Comprehension  Verbalized understanding;Returned demonstration;Verbal cues required;Need further instruction         SLP Long Term Goals - 02/11/19 1714      SLP LONG TERM GOAL #1   Title  Pt  wil name at least 15 items in a named category independently with 100% accuracy.    Time  8    Period  Weeks    Status  New    Target Date  04/13/19      SLP LONG TERM GOAL #2   Title  After listening to a 5 sentence paragraph,  pt will answer y/n and wh questions with 80% accuracy independently.    Time  8    Period  Weeks    Status  New    Target Date  04/13/19      SLP LONG TERM GOAL #3   Title  Pt will use external and internal memory aids and compensatory strategies to aid short term recall  independently.    Time  8    Period  Weeks    Status  New    Target Date  04/13/19       Plan - 03/14/19 0820    Clinical Impression Statement  Pt demonstrated consistent understanding and use of compensatory strategies to aid word finding and short term recall. SLP  provided strong encouragement to commit to a tangible consistent daily practice of strategies in home environment to aid functional carryover, pt stated understanding and agreement. Discussed strategies to reinforce accountability for daily practice, pt stated interest and agreement in consistent home practice to promote improvement and maintenance of short term recall and word finding skills. Pt will continue to benefit from skilled ST services to improve functional memory & communication skills   Speech Therapy Frequency  2x / week    Duration  Other (comment)   8 weeks   Treatment/Interventions  Compensatory strategies;SLP instruction and feedback;Functional tasks;Patient/family education;Cognitive reorganization;Internal/external aids;Cueing hierarchy    Potential to Achieve Goals  Good    Potential Considerations  Ability to learn/carryover information;Family/community support;Cooperation/participation level;Previous level of function    SLP Home Exercise Plan  divergent naming, short term memory tasks, regular home use of compensatory strategies    Consulted and Agree with Plan of Care  Patient;Family member/caregiver    Family Member Consulted  husband       Patient will benefit from skilled therapeutic intervention in order to improve the following deficits and impairments:   Cognitive communication deficit    Problem List Patient Active Problem List   Diagnosis Date Noted  . Acute anxiety 12/31/2015  . Pharyngitis 09/29/2015  . Hypertension 12/03/2014  . TIA (transient ischemic attack) 12/03/2014  . Diabetes ([place]) 11/12/2014  . Hyperlipemia 11/12/2014  . Abnormal mammogram 11/12/2014  . Anemia 11/12/2014  . Diverticulitis 11/12/2014    Juda Toepfer, MA, CCC-SLP 03/14/2019, 8:25 AM  [place] MAIN [facility] SERVICES [address] [place], [place], [address] Phone: [phone]   Fax:  [phone]   Name: Ashley Kramer MRN:  [id number] Date of Birth: 17-Jan-1932

## 2019-03-18 ENCOUNTER — Ambulatory Visit: Payer: Medicare Other | Admitting: Speech Pathology

## 2019-03-21 ENCOUNTER — Ambulatory Visit: Payer: Medicare Other | Admitting: Speech Pathology

## 2019-12-27 ENCOUNTER — Other Ambulatory Visit: Payer: Self-pay | Admitting: Family Medicine

## 2019-12-27 DIAGNOSIS — Z1231 Encounter for screening mammogram for malignant neoplasm of breast: Secondary | ICD-10-CM

## 2020-01-16 ENCOUNTER — Ambulatory Visit
Admission: RE | Admit: 2020-01-16 | Discharge: 2020-01-16 | Disposition: A | Discharge status: Home / Self Care | Payer: Medicare PPO | Source: Ambulatory Visit | Attending: Family Medicine | Admitting: Family Medicine

## 2020-01-16 DIAGNOSIS — Z1231 Encounter for screening mammogram for malignant neoplasm of breast: Secondary | ICD-10-CM | POA: Insufficient documentation

## 2020-09-22 IMAGING — MG DIGITAL SCREENING BILATERAL MAMMOGRAM WITH TOMO AND CAD
8 series · 9 of 24 positions shown · non-contrast
Comparison: Previous exam(s).

CLINICAL DATA: Screening.

EXAM:
DIGITAL SCREENING BILATERAL MAMMOGRAM WITH TOMO AND CAD

[R MLO synth-2D]
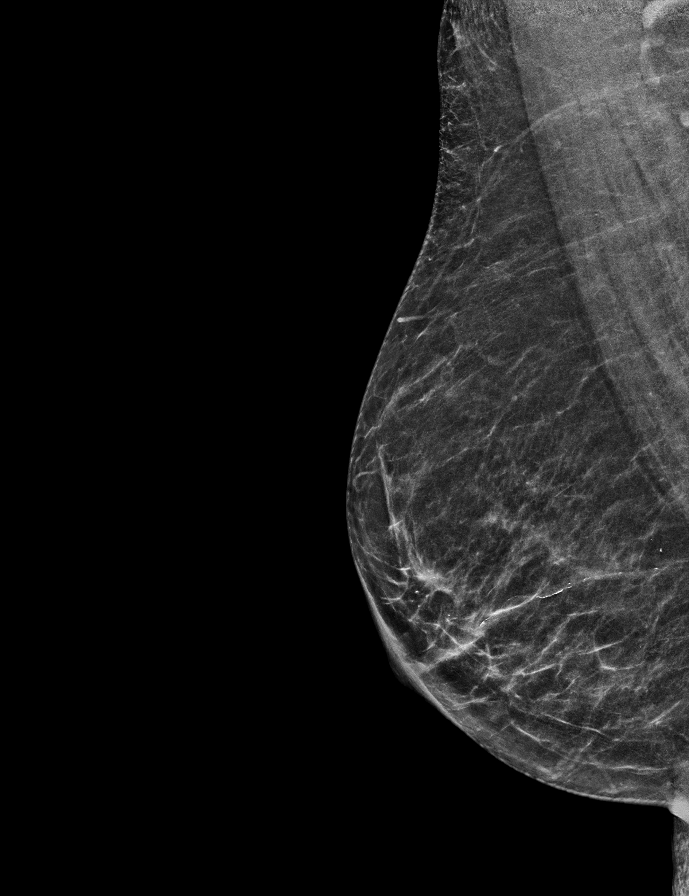

[R CC synth-2D]
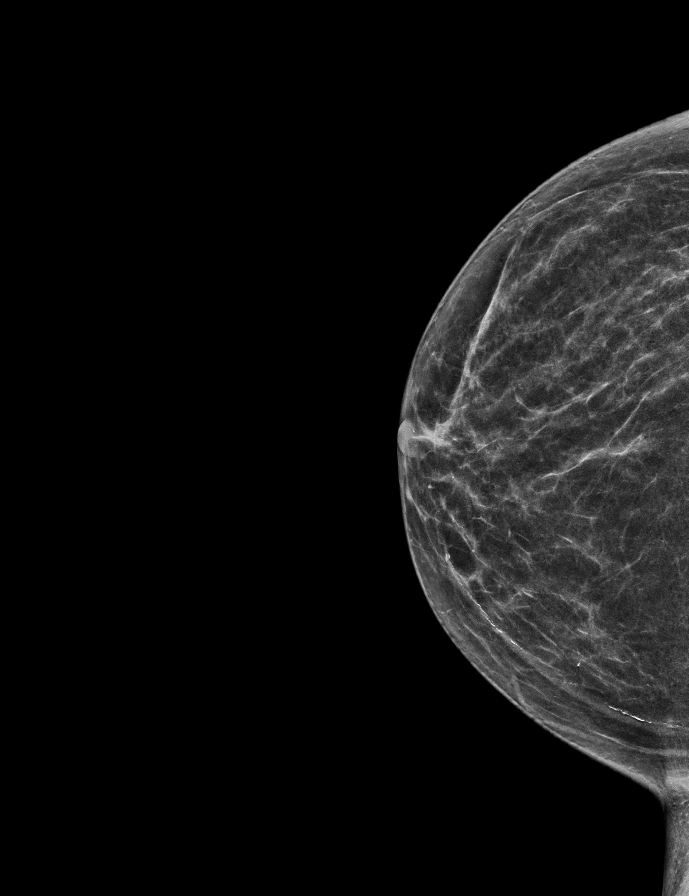

[L CC synth-2D]
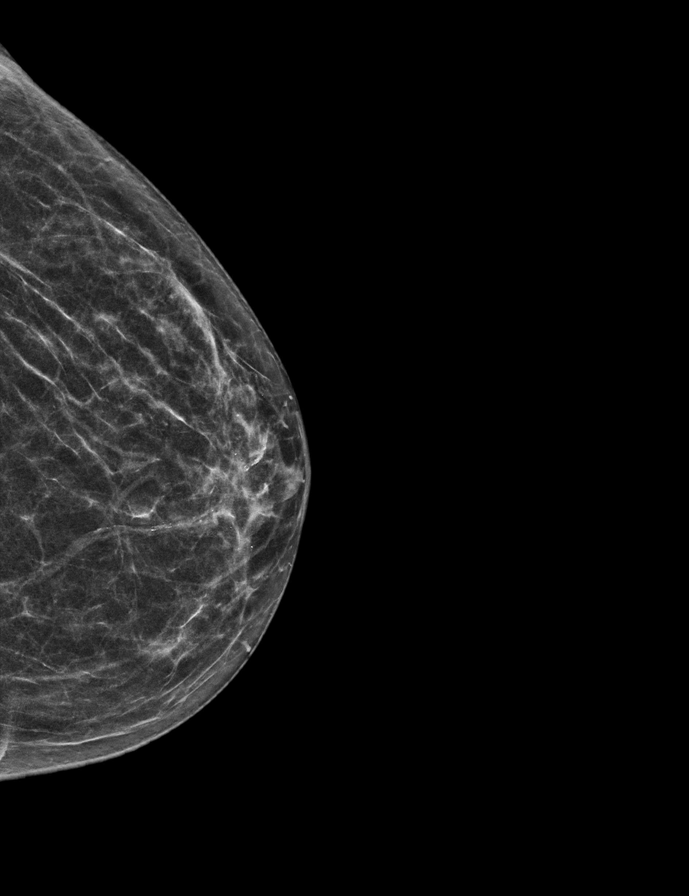

[L MLO synth-2D]
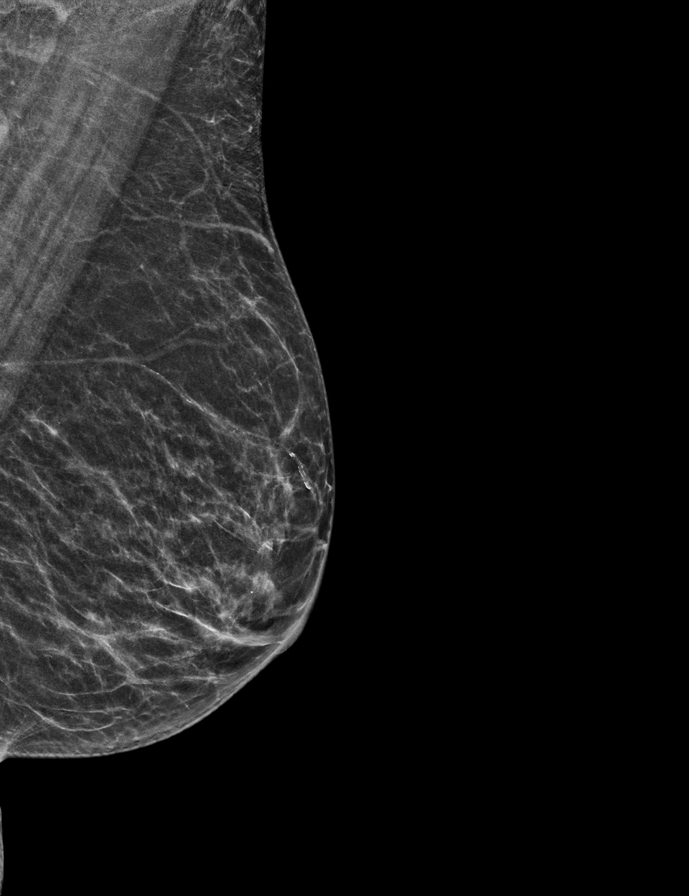

[R CC tomo · 2 of 50 frames shown]
[frame 17/50]
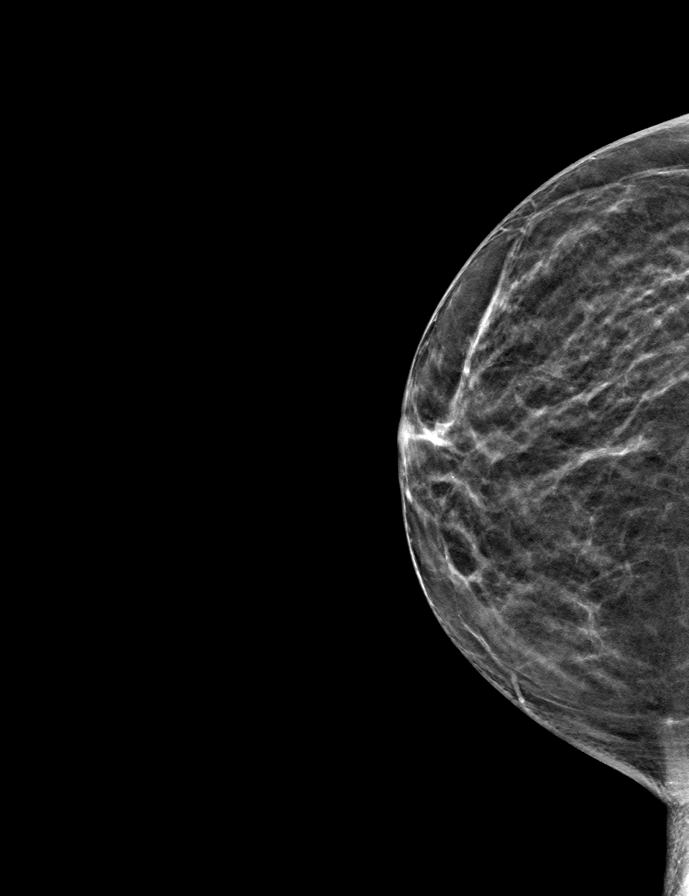
[frame 25/50]
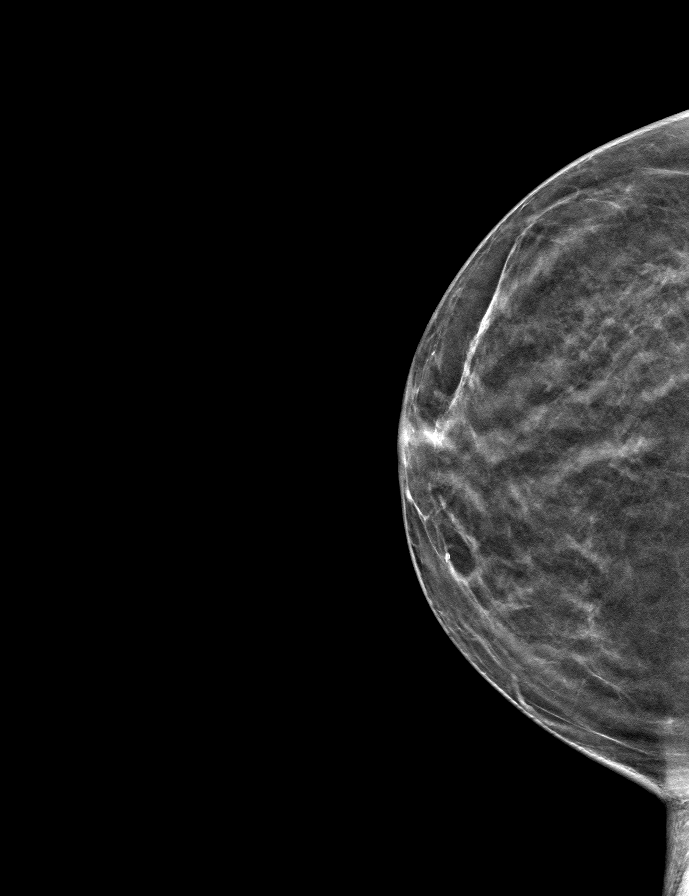

[R MLO tomo · tomo slice 25/50.0]
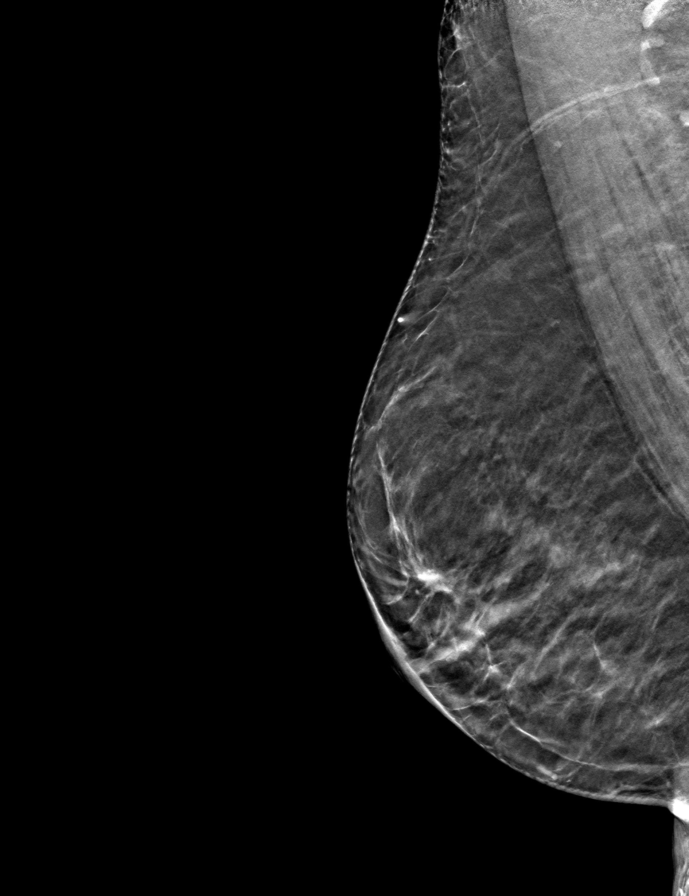

[L MLO tomo · tomo slice 24/47.0]
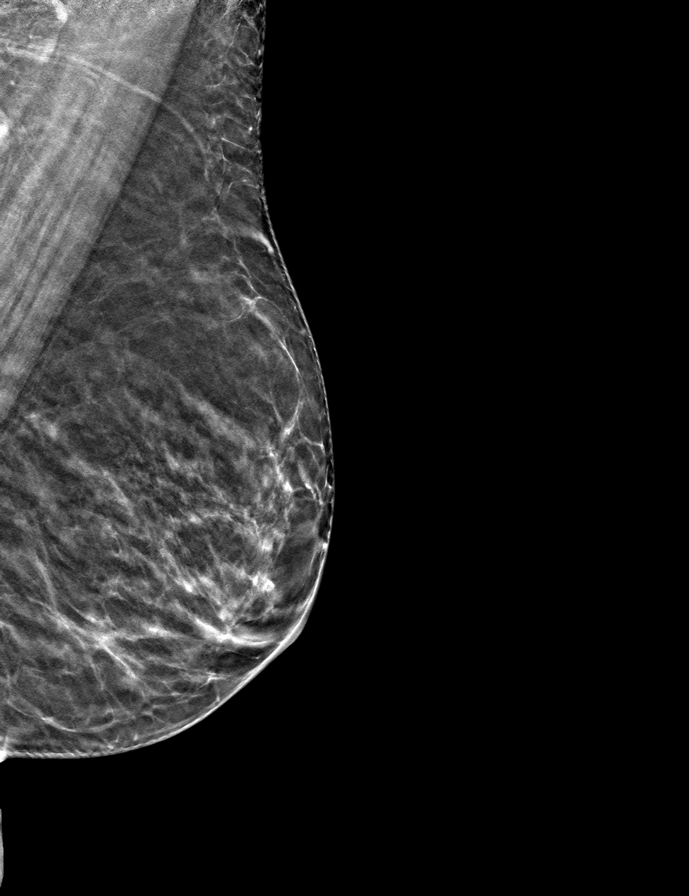

[L CC tomo · tomo slice 26/51.0]
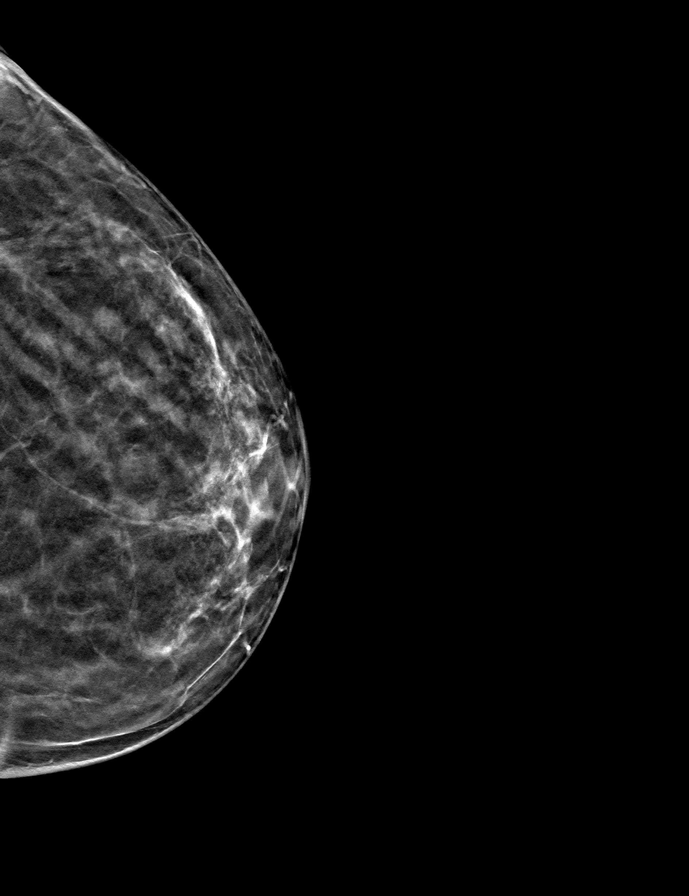

[9 of 24 positions shown; findings below may reference images not displayed]

ACR Breast Density Category b: There are scattered areas of
fibroglandular density.
FINDINGS: There are no findings suspicious for malignancy. Images were
processed with CAD.
IMPRESSION: No mammographic evidence of malignancy. A result letter of this
screening mammogram will be mailed directly to the patient.

RECOMMENDATION:
Screening mammogram in one year. (Code:[id number])

BI-RADS CATEGORY  1: Negative.

## 2020-10-31 ENCOUNTER — Other Ambulatory Visit: Payer: Self-pay

## 2020-10-31 ENCOUNTER — Encounter: Payer: Self-pay | Admitting: Emergency Medicine

## 2020-10-31 ENCOUNTER — Emergency Department: Payer: Medicare PPO

## 2020-10-31 ENCOUNTER — Emergency Department
Admission: EM | Admit: 2020-10-31 | Discharge: 2020-10-31 | Disposition: A | Discharge status: Home / Self Care | Payer: Medicare PPO | Attending: Emergency Medicine | Admitting: Emergency Medicine

## 2020-10-31 DIAGNOSIS — Z79899 Other long term (current) drug therapy: Secondary | ICD-10-CM | POA: Diagnosis not present

## 2020-10-31 DIAGNOSIS — Z87891 Personal history of nicotine dependence: Secondary | ICD-10-CM | POA: Insufficient documentation

## 2020-10-31 DIAGNOSIS — E871 Hypo-osmolality and hyponatremia: Secondary | ICD-10-CM | POA: Diagnosis not present

## 2020-10-31 DIAGNOSIS — E119 Type 2 diabetes mellitus without complications: Secondary | ICD-10-CM | POA: Diagnosis not present

## 2020-10-31 DIAGNOSIS — R55 Syncope and collapse: Secondary | ICD-10-CM | POA: Diagnosis not present

## 2020-10-31 DIAGNOSIS — Z7984 Long term (current) use of oral hypoglycemic drugs: Secondary | ICD-10-CM | POA: Insufficient documentation

## 2020-10-31 DIAGNOSIS — I10 Essential (primary) hypertension: Secondary | ICD-10-CM | POA: Diagnosis not present

## 2020-10-31 LAB — URINALYSIS, COMPLETE (UACMP) WITH MICROSCOPIC
Bacteria, UA: NONE SEEN
Bilirubin Urine: NEGATIVE
Glucose, UA: NEGATIVE mg/dL
Hgb urine dipstick: NEGATIVE
Ketones, ur: NEGATIVE mg/dL
Leukocytes,Ua: NEGATIVE
Nitrite: NEGATIVE
Protein, ur: NEGATIVE mg/dL
Specific Gravity, Urine: 1.01 (ref 1.005–1.030)
pH: 7 (ref 5.0–8.0)

## 2020-10-31 LAB — CBC
HCT: 36.1 % (ref 36.0–46.0)
Hemoglobin: 12.6 g/dL (ref 12.0–15.0)
MCH: 30.1 pg (ref 26.0–34.0)
MCHC: 34.9 g/dL (ref 30.0–36.0)
MCV: 86.2 fL (ref 80.0–100.0)
Platelets: 343 10*3/uL (ref 150–400)
RBC: 4.19 MIL/uL (ref 3.87–5.11)
RDW: 13.8 % (ref 11.5–15.5)
WBC: 8.1 10*3/uL (ref 4.0–10.5)
nRBC: 0 % (ref 0.0–0.2)

## 2020-10-31 LAB — BASIC METABOLIC PANEL
Anion gap: 9 (ref 5–15)
BUN: 10 mg/dL (ref 8–23)
CO2: 25 mmol/L (ref 22–32)
Calcium: 9.5 mg/dL (ref 8.9–10.3)
Chloride: 95 mmol/L — ABNORMAL LOW (ref 98–111)
Creatinine, Ser: 0.76 mg/dL (ref 0.44–1.00)
GFR, Estimated: >60 mL/min (ref >60)
Glucose, Bld: 109 mg/dL — ABNORMAL HIGH (ref 70–99)
Potassium: 4.2 mmol/L (ref 3.5–5.1)
Sodium: 129 mmol/L — ABNORMAL LOW (ref 135–145)

## 2020-10-31 LAB — TROPONIN I (HIGH SENSITIVITY): Troponin I (High Sensitivity): 4 ng/L (ref <18)

## 2020-10-31 MED ORDER — SODIUM CHLORIDE 0.9 % IV BOLUS
1000.0000 mL | Freq: Once | INTRAVENOUS | Status: AC
  Administered 2020-10-31: 1000 mL via INTRAVENOUS

## 2020-10-31 NOTE — ED Triage Notes (Signed)
Pt to ED via wheelchair from [facility], per [facility] staff pt had syncopal episode, per [facility] staff pt with c/o dizziness, HTN and hyperglycemia, sent from [facility] for further eval.

## 2020-10-31 NOTE — Discharge Instructions (Addendum)
Component Ref Range & Units 12:11   Troponin I (High Sensitivity) <18 ng/L 4    & Units 12:11 4 yr ago 5 yr ago 6 yr ago 7 yr ago 8 yr ago   WBC 4.0 - 10.5 K/uL 8.1  14.4 High  R  7.1 R  7.3 R  8.4 R  13.6 High  R   RBC 3.87 - 5.11 MIL/uL 4.19  4.51 R  4.67 R   4.55 R  4.58 R   Hemoglobin 12.0 - 15.0 g/dL 23.5  57.3 R  22.0 R  6.1 Abnormal  R     HCT 36.0 - 46.0 % 36.1  39.0 R  41.4 R  38 R     MCV 80.0 - 100.0 fL 86.2  86.6  89 R   86 R  89 R   MCH 26.0 - 34.0 pg 30.1  29.6  28.9 R   29.0  29.1   MCHC 30.0 - 36.0 g/dL 25.4  27.0 R  62.3 R   33.7 R  32.9 R   RDW 11.5 - 15.5 % 13.8  13.9 R  14.7 R   14.6 High  R  14.5 R   Platelets 150 - 400 K/uL 343  341 R  351 R  395 R  359 R     0 Result Notes  Component Ref Range & Units 12:11 4 yr ago 5 yr ago 6 yr ago 7 yr ago 8 yr ago  Sodium 135 - 145 mmol/L 129 Low   139  142 R  141 R  137 R  140 R   Potassium 3.5 - 5.1 mmol/L 4.2  3.8  5.0 R   4.5  4.2   Chloride 98 - 111 mmol/L 95 Low   103 R  102 R   106 R  107 R   CO2 22 - 32 mmol/L 25  28  26  R   29 R  24 R   Glucose, Bld 70 - 99 mg/dL High   94 R  762 High  R  154 R  125 High  R  114 High  R   Comment: Glucose reference range applies only to samples taken after fasting for at least 8 hours.  BUN 8 - 23 mg/dL 10  16 R  12 R   13 R  14 R   Creatinine, Ser 0.44 - 1.00 mg/dL 831  5.17  6.16 R   0.73 R  0.93 R   Calcium 8.9 - 10.3 mg/dL 9.5  9.9  7.10 R   9.8 R  9.6 R   GFR, Estimated >60 mL/min >60          CLINICAL DATA:  Poly trauma, critical, head/cervical spine injury suspected. Syncope, possible head trauma.   EXAM: CT HEAD WITHOUT CONTRAST   CT CERVICAL SPINE WITHOUT CONTRAST   TECHNIQUE: Multidetector CT imaging of the head and cervical spine was performed following the standard protocol without intravenous contrast. Multiplanar CT image reconstructions of the cervical spine were also generated.   COMPARISON:  Brain MRI 01/16/2019. Head CT 10/15/2016.   FINDINGS: CT  HEAD FINDINGS   Brain:   Mild-to-moderate generalized cerebral atrophy. Comparatively mild cerebellar atrophy.   Mild ill-defined hypoattenuation within the cerebral white matter is nonspecific, but compatible with chronic small vessel ischemic disease.   There is no acute intracranial hemorrhage.   No demarcated cortical infarct.   No extra-axial fluid collection.   No evidence  of intracranial mass.   No midline shift.   Vascular: No hyperdense vessel.  Atherosclerotic calcifications.   Skull: Normal. Negative for fracture or focal lesion.   Sinuses/Orbits: Visualized orbits show no acute finding. Trace bilateral ethmoid sinus mucosal thickening.   CT CERVICAL SPINE FINDINGS   Alignment: Mild reversal of the expected cervical lordosis. Trace C3-C4 grade 1 anterolisthesis.   Skull base and vertebrae: The basion-dental and atlanto-dental intervals are maintained.No evidence of acute fracture to the cervical spine. Developmental or degenerative fusion across the C2-C3 disc space. Bilateral C2-C3 facet joint ankylosis. Bilateral facet joint ankylosis also present at C3-C4.   Soft tissues and spinal canal: No prevertebral fluid or swelling. No visible canal hematoma.   Disc levels: Cervical spondylosis with multilevel disc space narrowing, disc bulges, uncovertebral hypertrophy and facet arthrosis. Posterior disc osteophytes at C4-C5 and C5-C6. Advanced disc space narrowing at C4-C5 and C5-C6. No appreciable high-grade spinal canal stenosis. Multilevel bony neural foraminal narrowing.   Upper chest: No consolidation within the imaged lung apices. No visible pneumothorax.   IMPRESSION: CT head:   1. No evidence of acute intracranial abnormality. 2. Mild cerebral white matter chronic small vessel ischemic disease. 3. Mild-to-moderate generalized cerebral atrophy with comparatively mild cerebellar atrophy.   CT cervical spine:   1. No evidence of acute fracture  to the cervical spine. 2. Mild nonspecific reversal of the expected cervical lordosis. 3. Trace C3-C4 grade 1 anterolisthesis. 4. Cervical spondylosis and multilevel fusion, as described.

## 2020-10-31 NOTE — ED Triage Notes (Signed)
Pt reports that she passed out this am. She reports that she felt dizzy and knew she was falling. She reports that she dis not have LOC or hit her head.

## 2020-10-31 NOTE — ED Notes (Signed)
Pt assisted to bedside commode with steady gait.

## 2020-10-31 NOTE — ED Notes (Signed)
Received report from [place], [place]

## 2020-10-31 NOTE — ED Notes (Signed)
Dr Robinson at bedside 

## 2020-10-31 NOTE — ED Notes (Signed)
Pt ambulatory to and from RR without any distress or complaints.

## 2020-10-31 NOTE — ED Notes (Signed)
Pt given turkey tray and water  

## 2020-10-31 NOTE — ED Provider Notes (Signed)
[facility] Emergency Department Provider Note  ____________________________________________  Time seen: Approximately 7:39 PM  I have reviewed the triage vital signs and the nursing notes.   HISTORY  Chief Complaint Loss of Consciousness    HPI [place] is a 87 y.o. female who presents emergency department for a fall and syncope earlier today.  Patient states that she was at home, was bending over to pet her dog when she had a syncopal episode falling forward.  She states that she does not believe that she hit her head but is not quite sure.  She denied any other musculoskeletal complaints include neck pain, bilateral upper or lower extremity pain.  No back pain.  She states that she could not of had syncope longer than a few seconds.  Patient currently is relatively asymptomatic, denied any headache, vision changes, neck pain, chest pain, shortness of breath, Donnell pain, nausea or vomiting.  Patient with a history of anemia, diabetes, hypertension.       Past Medical History:  Diagnosis Date   Anemia    Diabetes mellitus without complication (HCC)    Diverticulitis    Hyperlipidemia     Patient Active Problem List   Diagnosis Date Noted   Acute anxiety 12/31/2015   Pharyngitis 09/29/2015   Hypertension 12/03/2014   TIA (transient ischemic attack) 12/03/2014   Diabetes (HCC) 11/12/2014   Hyperlipemia 11/12/2014   Abnormal mammogram 11/12/2014   Anemia 11/12/2014   Diverticulitis 11/12/2014    Past Surgical History:  Procedure Laterality Date   APPENDECTOMY     BREAST EXCISIONAL BIOPSY     BREAST SURGERY  2012   BIOPSY    ESOPHAGOGASTRODUODENOSCOPY  2012   MEDIAL PARTIAL KNEE REPLACEMENT Right 2012   TUBAL LIGATION  1974    Prior to Admission medications   Medication Sig Start Date End Date Taking? Authorizing Provider  atorvastatin (LIPITOR) 40 MG tablet Take 1 tablet (40 mg total) by mouth daily. 02/11/16   Janeann Forehand., MD  carvedilol (COREG) 6.25 MG tablet Take 1 tablet (6.25 mg total) by mouth 2 (two) times daily. 02/11/16   Janeann Forehand., MD  Cholecalciferol (VITAMIN D-3 PO) Take by mouth.    [provider]  clopidogrel (PLAVIX) 75 MG tablet Take 1 tablet (75 mg total) by mouth daily. 02/11/16   Janeann Forehand., MD  EPINEPHrine 0.3 mg/0.3 mL IJ SOAJ injection Inject 0.3 mg into the muscle once. EPI PEN INJ    [provider]  HYDROcodone-acetaminophen (NORCO) 5-325 MG tablet Take 1 tablet by mouth every 6 (six) hours as needed for severe pain. 10/07/16   Merrily Brittle, MD  JANUVIA 50 MG tablet take 1 tablet by mouth once daily 05/02/16   Janeann Forehand., MD  loratadine (CLARITIN) 10 MG tablet Take 1 tablet (10 mg total) by mouth daily. 09/29/15   Janeann Forehand., MD  metFORMIN (GLUCOPHAGE) 500 MG tablet Take 1 tablet (500 mg total) by mouth 2 (two) times daily with a meal. 12/31/15   Janeann Forehand., MD  Multiple Vitamin (MULTIVITAMIN) capsule Take 1 capsule by mouth daily.    [provider]  Omega-3 Fatty Acids (FISH OIL) 1000 MG CPDR Take by mouth.    [provider]  sertraline (ZOLOFT) 25 MG tablet Take 1 tablet (25 mg total) by mouth daily. 12/31/15   Janeann Forehand., MD  sitaGLIPtin (JANUVIA) 50 MG tablet Take 1 tablet (50  mg total) by mouth daily. 12/31/15   Janeann Forehand., MD    Allergies Patient has no known allergies.  Family History  Problem Relation Age of Onset   Stroke Mother    Cancer Mother    Diabetes Father    Alzheimer's disease Father     Social History Social History   Tobacco Use   Smoking status: Former    Pack years: 0.00    Types: Cigarettes    Quit date: 11/11/1997    Years since quitting: 22.9   Smokeless tobacco: Never  Substance Use Topics   Alcohol use: No   Drug use: No     Review of Systems  Constitutional: No fever/chills Eyes: No visual changes. No discharge ENT: No upper  respiratory complaints. Cardiovascular: no chest pain. Respiratory: no cough. No SOB. Gastrointestinal: No abdominal pain.  No nausea, no vomiting.  No diarrhea.  No constipation. Genitourinary: Negative for dysuria. No hematuria Musculoskeletal: Negative for musculoskeletal pain. Skin: Negative for rash, abrasions, lacerations, ecchymosis. Neurological: Positive for syncope.  Negative for headaches, focal weakness or numbness.  10 System ROS otherwise negative.  ____________________________________________   PHYSICAL EXAM:  VITAL SIGNS: ED Triage Vitals  Enc Vitals Group     BP 10/31/20 1155 127/73     Pulse Rate 10/31/20 1155 65     Resp 10/31/20 1155 18     Temp 10/31/20 1155 98.5 F (36.9 C)     Temp Source 10/31/20 1155 Oral     SpO2 10/31/20 1155 96 %     Weight 10/31/20 1158 133 lb (60.3 kg)     Height 10/31/20 1158 5\' 2"  (1.575 m)     Head Circumference --      Peak Flow --      Pain Score 10/31/20 1157 0     Pain Loc --      Pain Edu? --      Excl. in GC? --      Constitutional: Alert and oriented. Well appearing and in no acute distress. Eyes: Conjunctivae are normal. PERRL. EOMI. Head: Atraumatic.  No visible signs of trauma to the skull, face or neck.  Nontender to palpation of the osseous structures.  No palpable abnormality or crepitus.  No battle signs, raccoon eyes, serosanguineous fluid drainage from the ears or nares. ENT:      Ears:       Nose: No congestion/rhinnorhea.      Mouth/Throat: Mucous membranes are moist.  Neck: No stridor.  No cervical spine tenderness to palpation  Cardiovascular: Normal rate, regular rhythm. Normal S1 and S2.  Good peripheral circulation. Respiratory: Normal respiratory effort without tachypnea or retractions. Lungs CTAB. Good air entry to the bases with no decreased or absent breath sounds. Musculoskeletal: Full range of motion to all extremities. No gross deformities appreciated. Neurologic:  Normal speech and  language. No gross focal neurologic deficits are appreciated.  Cranial nerves II through XII grossly intact.  Negative Romberg complaining of drip. Skin:  Skin is warm, dry and intact. No rash noted. Psychiatric: Mood and affect are normal. Speech and behavior are normal. Patient exhibits appropriate insight and judgement.   ____________________________________________   LABS (all labs ordered are listed, but only abnormal results are displayed)  Labs Reviewed  BASIC METABOLIC PANEL - Abnormal; Notable for the following components:      Result Value   Sodium 129 (*)    Chloride 95 (*)    Glucose, Bld 109 (*)  All other components within normal limits  URINALYSIS, COMPLETE (UACMP) WITH MICROSCOPIC - Abnormal; Notable for the following components:   Color, Urine YELLOW (*)    APPearance CLEAR (*)    All other components within normal limits  CBC  CBG MONITORING, ED  TROPONIN I (HIGH SENSITIVITY)  TROPONIN I (HIGH SENSITIVITY)   ____________________________________________  EKG   ____________________________________________  RADIOLOGY I personally viewed and evaluated these images as part of my medical decision making, as well as reviewing the written report by the radiologist.  ED Provider Interpretation: No acute traumatic findings on CT scan of the head and neck.  No evidence of intracranial hemorrhage or ischemia.  CT Head Wo Contrast  Result Date: 10/31/2020 CLINICAL DATA:  Poly trauma, critical, head/cervical spine injury suspected. Syncope, possible head trauma. EXAM: CT HEAD WITHOUT CONTRAST CT CERVICAL SPINE WITHOUT CONTRAST TECHNIQUE: Multidetector CT imaging of the head and cervical spine was performed following the standard protocol without intravenous contrast. Multiplanar CT image reconstructions of the cervical spine were also generated. COMPARISON:  Brain MRI 01/16/2019. Head CT 10/15/2016. FINDINGS: CT HEAD FINDINGS Brain: Mild-to-moderate generalized cerebral  atrophy. Comparatively mild cerebellar atrophy. Mild ill-defined hypoattenuation within the cerebral white matter is nonspecific, but compatible with chronic small vessel ischemic disease. There is no acute intracranial hemorrhage. No demarcated cortical infarct. No extra-axial fluid collection. No evidence of intracranial mass. No midline shift. Vascular: No hyperdense vessel.  Atherosclerotic calcifications. Skull: Normal. Negative for fracture or focal lesion. Sinuses/Orbits: Visualized orbits show no acute finding. Trace bilateral ethmoid sinus mucosal thickening. CT CERVICAL SPINE FINDINGS Alignment: Mild reversal of the expected cervical lordosis. Trace C3-C4 grade 1 anterolisthesis. Skull base and vertebrae: The basion-dental and atlanto-dental intervals are maintained.No evidence of acute fracture to the cervical spine. Developmental or degenerative fusion across the C2-C3 disc space. Bilateral C2-C3 facet joint ankylosis. Bilateral facet joint ankylosis also present at C3-C4. Soft tissues and spinal canal: No prevertebral fluid or swelling. No visible canal hematoma. Disc levels: Cervical spondylosis with multilevel disc space narrowing, disc bulges, uncovertebral hypertrophy and facet arthrosis. Posterior disc osteophytes at C4-C5 and C5-C6. Advanced disc space narrowing at C4-C5 and C5-C6. No appreciable high-grade spinal canal stenosis. Multilevel bony neural foraminal narrowing. Upper chest: No consolidation within the imaged lung apices. No visible pneumothorax. IMPRESSION: CT head: 1. No evidence of acute intracranial abnormality. 2. Mild cerebral white matter chronic small vessel ischemic disease. 3. Mild-to-moderate generalized cerebral atrophy with comparatively mild cerebellar atrophy. CT cervical spine: 1. No evidence of acute fracture to the cervical spine. 2. Mild nonspecific reversal of the expected cervical lordosis. 3. Trace C3-C4 grade 1 anterolisthesis. 4. Cervical spondylosis and  multilevel fusion, as described. Electronically Signed   By: Jackey Loge DO   On: 10/31/2020 17:47   CT Cervical Spine Wo Contrast  Result Date: 10/31/2020 CLINICAL DATA:  Poly trauma, critical, head/cervical spine injury suspected. Syncope, possible head trauma. EXAM: CT HEAD WITHOUT CONTRAST CT CERVICAL SPINE WITHOUT CONTRAST TECHNIQUE: Multidetector CT imaging of the head and cervical spine was performed following the standard protocol without intravenous contrast. Multiplanar CT image reconstructions of the cervical spine were also generated. COMPARISON:  Brain MRI 01/16/2019. Head CT 10/15/2016. FINDINGS: CT HEAD FINDINGS Brain: Mild-to-moderate generalized cerebral atrophy. Comparatively mild cerebellar atrophy. Mild ill-defined hypoattenuation within the cerebral white matter is nonspecific, but compatible with chronic small vessel ischemic disease. There is no acute intracranial hemorrhage. No demarcated cortical infarct. No extra-axial fluid collection. No evidence of intracranial mass. No midline shift.  Vascular: No hyperdense vessel.  Atherosclerotic calcifications. Skull: Normal. Negative for fracture or focal lesion. Sinuses/Orbits: Visualized orbits show no acute finding. Trace bilateral ethmoid sinus mucosal thickening. CT CERVICAL SPINE FINDINGS Alignment: Mild reversal of the expected cervical lordosis. Trace C3-C4 grade 1 anterolisthesis. Skull base and vertebrae: The basion-dental and atlanto-dental intervals are maintained.No evidence of acute fracture to the cervical spine. Developmental or degenerative fusion across the C2-C3 disc space. Bilateral C2-C3 facet joint ankylosis. Bilateral facet joint ankylosis also present at C3-C4. Soft tissues and spinal canal: No prevertebral fluid or swelling. No visible canal hematoma. Disc levels: Cervical spondylosis with multilevel disc space narrowing, disc bulges, uncovertebral hypertrophy and facet arthrosis. Posterior disc osteophytes at C4-C5 and  C5-C6. Advanced disc space narrowing at C4-C5 and C5-C6. No appreciable high-grade spinal canal stenosis. Multilevel bony neural foraminal narrowing. Upper chest: No consolidation within the imaged lung apices. No visible pneumothorax. IMPRESSION: CT head: 1. No evidence of acute intracranial abnormality. 2. Mild cerebral white matter chronic small vessel ischemic disease. 3. Mild-to-moderate generalized cerebral atrophy with comparatively mild cerebellar atrophy. CT cervical spine: 1. No evidence of acute fracture to the cervical spine. 2. Mild nonspecific reversal of the expected cervical lordosis. 3. Trace C3-C4 grade 1 anterolisthesis. 4. Cervical spondylosis and multilevel fusion, as described. Electronically Signed   By: Jackey LogeKyle  Golden DO   On: 10/31/2020 17:47    ____________________________________________    PROCEDURES  Procedure(s) performed:    Procedures    Medications  sodium chloride 0.9 % bolus 1,000 mL (0 mLs Intravenous Stopped 10/31/20 1600)     ____________________________________________   INITIAL IMPRESSION / ASSESSMENT AND PLAN / ED COURSE  Pertinent labs & imaging results that were available during my care of the patient were reviewed by me and considered in my medical decision making (see chart for details).  Review of the [place] CSRS was performed in accordance of the [facility] prior to dispensing any controlled drugs.  Clinical Course as of 10/31/20 1943  Sat Oct 31, 2020  1930 Patient reassessed.  Feels significantly improved after having something to eat.  As well as IV fluids.  Denies any chest pain.  Troponin negative.  Does not seem consistent with ACS or cardiac etiology.  Suspect component of dehydration.  Admits to drinking wine daily and has not been drinking much fluid for hydration.  Patient requesting discharge home.  Insulin is that she is able to ambulate with steady gait and she is not orthostatic after IV fluids think that that is reasonable. [PR]     Clinical Course User Index [PR] Willy Eddyobinson, Patrick, MD          Patient's diagnosis is consistent with syncope.  Patient presented to the emergency department complaining of syncope after she bent over to her dog.  Patient has had a couple falls in the last couple months.  She states that she had syncope for less than a couple of seconds today.  She did not believe that she hit her head but obviously cannot remember.  Patient was currently denying any headache, visual changes, neck pain or other concerning symptoms.  There has been several falls in the past several months but socially patient may be indulging in increased alcohol consumption per the husband..  Today patient was neurologically intact on exam.  No acute findings.  Patient's labs revealed mild hyponatremia and fluids are given.  She has not had any orthostatic changes.  Imaging was reassuring.  At this time patient appears stable for discharge.  Follow-up primary care as needed.  Patient is given ED precautions to return to the ED for any worsening or new symptoms.     ____________________________________________  FINAL CLINICAL IMPRESSION(S) / ED DIAGNOSES  Final diagnoses:  Syncope, unspecified syncope type  Low sodium levels      NEW MEDICATIONS STARTED DURING THIS VISIT:  ED Discharge Orders     None           This chart was dictated using voice recognition software/Dragon. Despite best efforts to proofread, errors can occur which can change the meaning. Any change was purely unintentional.    Racheal Patches, PA-C 10/31/20 1943    Willy Eddy, MD 10/31/20 2007

## 2022-04-18 ENCOUNTER — Other Ambulatory Visit (INDEPENDENT_AMBULATORY_CARE_PROVIDER_SITE_OTHER): Payer: Self-pay | Admitting: Vascular Surgery

## 2022-04-18 DIAGNOSIS — R9389 Abnormal findings on diagnostic imaging of other specified body structures: Secondary | ICD-10-CM

## 2022-04-19 ENCOUNTER — Ambulatory Visit (INDEPENDENT_AMBULATORY_CARE_PROVIDER_SITE_OTHER): Payer: Medicare PPO

## 2022-04-19 ENCOUNTER — Encounter (INDEPENDENT_AMBULATORY_CARE_PROVIDER_SITE_OTHER): Payer: Self-pay | Admitting: Vascular Surgery

## 2022-04-19 ENCOUNTER — Ambulatory Visit (INDEPENDENT_AMBULATORY_CARE_PROVIDER_SITE_OTHER): Payer: Medicare PPO | Admitting: Vascular Surgery

## 2022-04-19 VITALS — BP 124/68 | HR 61 | Resp 16 | Wt 132.0 lb

## 2022-04-19 DIAGNOSIS — I739 Peripheral vascular disease, unspecified: Secondary | ICD-10-CM | POA: Diagnosis not present

## 2022-04-19 DIAGNOSIS — E119 Type 2 diabetes mellitus without complications: Secondary | ICD-10-CM

## 2022-04-19 DIAGNOSIS — R9389 Abnormal findings on diagnostic imaging of other specified body structures: Secondary | ICD-10-CM | POA: Diagnosis not present

## 2022-04-19 DIAGNOSIS — I10 Essential (primary) hypertension: Secondary | ICD-10-CM | POA: Diagnosis not present

## 2022-04-19 NOTE — Assessment & Plan Note (Signed)
blood glucose control important in reducing the progression of atherosclerotic disease. Also, involved in wound healing. On appropriate medications.  

## 2022-04-19 NOTE — Assessment & Plan Note (Signed)
blood pressure control important in reducing the progression of atherosclerotic disease. On appropriate oral medications.  

## 2022-04-19 NOTE — Assessment & Plan Note (Signed)
Patient referred for abnormal home health screening exam for peripheral arterial disease.  Studies done today. Her ABIs today were 1.1 on the right with triphasic waveforms and normal digital pressures and 1.2 on the left with triphasic waveforms and normal digital pressures.  No significant arterial insufficiency appears to be present.  No further workup is planned at this time.  No worrisome symptoms.  Return to clinic as needed.

## 2022-04-19 NOTE — Progress Notes (Signed)
Patient ID: Ashley Kramer, female   DOB: 08-28-1931, 88 y.o.   MRN: [id number]  Chief Complaint  Patient presents with   New Patient (Initial Visit)    Ref Burnett Sheng consult PAD     HPI Ashley Kramer is a 88 y.o. female.  I am asked to see the patient by Dr. Burnett Sheng for evaluation of an abnormal home health screening study for peripheral arterial disease.  The patient does have risk factors for atherosclerotic peripheral arterial disease including diabetes and hyperlipidemia.  She does not have any significant symptoms of ischemic rest pain, ulceration, or disabling claudication symptoms.  Her ABIs today were 1.1 on the right with triphasic waveforms and normal digital pressures and 1.2 on the left with triphasic waveforms and normal digital pressures.  No significant arterial insufficiency appears to be present.   Past Medical History:  Diagnosis Date   Anemia    Diabetes mellitus without complication (HCC)    Diverticulitis    Hyperlipidemia     Past Surgical History:  Procedure Laterality Date   APPENDECTOMY     BREAST EXCISIONAL BIOPSY     BREAST SURGERY  2012   BIOPSY    ESOPHAGOGASTRODUODENOSCOPY  2012   MEDIAL PARTIAL KNEE REPLACEMENT Right 2012   TUBAL LIGATION  1974     Family History  Problem Relation Age of Onset   Stroke Mother    Cancer Mother    Diabetes Father    Alzheimer's disease Father       Social History   Tobacco Use   Smoking status: Former    Types: Cigarettes    Quit date: 11/11/1997    Years since quitting: 24.4   Smokeless tobacco: Never  Substance Use Topics   Alcohol use: No   Drug use: No     Allergies  Allergen Reactions   Wasp Venom Anaphylaxis   Spinach Diarrhea    Current Outpatient Medications  Medication Sig Dispense Refill   apixaban (ELIQUIS) 2.5 MG TABS tablet Take 2.5 mg by mouth daily.     atorvastatin (LIPITOR) 40 MG tablet Take 1 tablet (40 mg total) by mouth daily. 90 tablet 3   Biotin 1 MG CAPS  Take 1 mg by mouth daily.     carvedilol (COREG) 6.25 MG tablet Take 1 tablet (6.25 mg total) by mouth 2 (two) times daily. 180 tablet 4   Cholecalciferol (VITAMIN D-3 PO) Take by mouth.     EPINEPHrine 0.3 mg/0.3 mL IJ SOAJ injection Inject 0.3 mg into the muscle once. EPI PEN INJ     folic acid (FOLVITE) 1 MG tablet Take 1 mg by mouth daily.     glucosamine-chondroitin 500-400 MG tablet Take 1 tablet by mouth daily.     JANUVIA 50 MG tablet take 1 tablet by mouth once daily 30 tablet 6   metFORMIN (GLUCOPHAGE) 500 MG tablet Take 1 tablet (500 mg total) by mouth 2 (two) times daily with a meal. 180 tablet 3   Multiple Vitamin (MULTIVITAMIN) capsule Take 1 capsule by mouth daily.     Omega-3 Fatty Acids (FISH OIL) 1000 MG CPDR Take by mouth.     sertraline (ZOLOFT) 25 MG tablet Take 1 tablet (25 mg total) by mouth daily. 90 tablet 3   sitaGLIPtin (JANUVIA) 50 MG tablet Take 1 tablet (50 mg total) by mouth daily. 90 tablet 3   Turmeric (QC TUMERIC COMPLEX) 500 MG CAPS Take 500 mg by mouth daily.     clopidogrel (  PLAVIX) 75 MG tablet Take 1 tablet (75 mg total) by mouth daily. (Patient not taking: Reported on 04/19/2022) 90 tablet 3   HYDROcodone-acetaminophen (NORCO) 5-325 MG tablet Take 1 tablet by mouth every 6 (six) hours as needed for severe pain. (Patient not taking: Reported on 04/19/2022) 7 tablet 0   loratadine (CLARITIN) 10 MG tablet Take 1 tablet (10 mg total) by mouth daily. (Patient not taking: Reported on 04/19/2022) 30 tablet 0   No current facility-administered medications for this visit.      REVIEW OF SYSTEMS (Negative unless checked)  Constitutional: [] Weight loss  [] Fever  [] Chills Cardiac: [] Chest pain   [] Chest pressure   [] Palpitations   [] Shortness of breath when laying flat   [] Shortness of breath at rest   [] Shortness of breath with exertion. Vascular:  [] Pain in legs with walking   [] Pain in legs at rest   [] Pain in legs when laying flat   [] Claudication   [] Pain in  feet when walking  [] Pain in feet at rest  [] Pain in feet when laying flat   [] History of DVT   [] Phlebitis   [] Swelling in legs   [] Varicose veins   [] Non-healing ulcers Pulmonary:   [] Uses home oxygen   [] Productive cough   [] Hemoptysis   [] Wheeze  [] COPD   [] Asthma Neurologic:  [] Dizziness  [] Blackouts   [] Seizures   [] History of stroke   [] History of TIA  [] Aphasia   [] Temporary blindness   [] Dysphagia   [] Weakness or numbness in arms   [] Weakness or numbness in legs Musculoskeletal:  [x] Arthritis   [] Joint swelling   [] Joint pain   [] Low back pain Hematologic:  [] Easy bruising  [] Easy bleeding   [] Hypercoagulable state   [x] Anemic  [] Hepatitis Gastrointestinal:  [] Blood in stool   [] Vomiting blood  [] Gastroesophageal reflux/heartburn   [] Abdominal pain Genitourinary:  [] Chronic kidney disease   [] Difficult urination  [] Frequent urination  [] Burning with urination   [] Hematuria Skin:  [] Rashes   [] Ulcers   [] Wounds Psychological:  [] History of anxiety   []  History of major depression.    Physical Exam BP 124/68 (BP Location: Left Arm)   Pulse 61   Resp 16   Wt 132 lb (59.9 kg)   BMI 24.14 kg/m  Gen:  WD/WN, NAD. Appears much younger than stated age. Head: [place]/AT, No temporalis wasting.  Ear/Nose/Throat: Hearing grossly intact, nares w/o erythema or drainage, oropharynx w/o Erythema/Exudate Eyes: Conjunctiva clear, sclera non-icteric  Neck: trachea midline.  No JVD.  Pulmonary:  Good air movement, respirations not labored, no use of accessory muscles  Cardiac: RRR, no JVD Vascular:  Vessel Right Left  Radial Palpable Palpable                          DP Palpable Palpable  PT Palpable Palpable   Gastrointestinal:. No masses, surgical incisions, or scars. Musculoskeletal: M/S 5/5 throughout.  Extremities without ischemic changes.  No deformity or atrophy.  No edema. Neurologic: Sensation grossly intact in extremities.  Symmetrical.  Speech is fluent. Motor exam as listed  above. Psychiatric: Judgment intact, Mood & affect appropriate for pt's clinical situation. Dermatologic: No rashes or ulcers noted.  No cellulitis or open wounds.    Radiology No results found.  Labs No results found for this or any previous visit (from the past 2160 hour(s)).  Assessment/Plan:  PAD (peripheral artery disease) [facility]) Patient referred for abnormal home health screening exam for peripheral arterial disease.  Studies done today.  Her ABIs today were 1.1 on the right with triphasic waveforms and normal digital pressures and 1.2 on the left with triphasic waveforms and normal digital pressures.  No significant arterial insufficiency appears to be present.  No further workup is planned at this time.  No worrisome symptoms.  Return to clinic as needed.  Hypertension blood pressure control important in reducing the progression of atherosclerotic disease. On appropriate oral medications.   Diabetes blood glucose control important in reducing the progression of atherosclerotic disease. Also, involved in wound healing. On appropriate medications.      Festus Barren 04/19/2022, 10:29 AM   This note was created with Dragon medical transcription system.  Any errors from dictation are unintentional.

## 2022-12-26 ENCOUNTER — Encounter: Payer: Self-pay | Admitting: Dietician

## 2022-12-26 ENCOUNTER — Encounter: Payer: Medicare PPO | Attending: Family Medicine | Admitting: Dietician

## 2022-12-26 DIAGNOSIS — Z713 Dietary counseling and surveillance: Secondary | ICD-10-CM | POA: Diagnosis not present

## 2022-12-26 DIAGNOSIS — E119 Type 2 diabetes mellitus without complications: Secondary | ICD-10-CM | POA: Insufficient documentation

## 2022-12-26 NOTE — Progress Notes (Signed)
Diabetes Self-Management Education  Visit Type: First/Initial  Appt. Start Time: 1400 Appt. End Time: 1520  12/26/2022  [name], identified by name and date of birth, is a 89 y.o. female with a diagnosis of Diabetes: Type 2.   ASSESSMENT  There were no vitals taken for this visit. There is no height or weight on file to calculate BMI.  Pt reports taking metformin and Januvia for their DM, no side effects reported. Pt states they check FBG sporadically, usually no higher than 120. 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 Pt states they don't get much physical activity anymore. 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 Pt reports having memory difficulty at times. Pt reports a lighter breakfast and lunch, and largest meal is dinner. Breakfast is usually a fried egg sandwich on potato bread, or pancakes w/ sugar free syrup, lunch is commonly BLT, but may also just graze during the afternoon.    Diabetes Self-Management Education - 12/26/22 1421       Visit Information   Visit Type First/Initial      Initial Visit   Diabetes Type Type 2    Date Diagnosed 2012    Are you currently following a meal plan? No      Health Coping   How would you rate your overall health? Good      Psychosocial Assessment   Patient Belief/Attitude about Diabetes Afraid   Fear of not knowing anything about DM and how to improve   What is the hardest part about your diabetes right now, causing you the most concern, or is the most worrisome to you about your diabetes?   Making healty food and beverage choices    Self-care barriers Other (comment);Debilitated state due to current medical condition   Memory Loss   Self-management support Doctor's office;Family    Other persons present Patient    Patient Concerns Nutrition/Meal planning;Healthy  Lifestyle;Glycemic Control    Special Needs None    Preferred Learning Style No preference indicated    Learning Readiness Change in progress    How often do you need to have someone help you when you read instructions, pamphlets, or other written materials from your doctor or pharmacy? 3 - Sometimes    What is the last grade level you completed in school? Master's      Pre-Education Assessment   Patient understands the diabetes disease and treatment process. Needs Instruction    Patient understands incorporating nutritional management into lifestyle. Needs Instruction    Patient undertands incorporating physical activity into lifestyle. Needs Instruction    Patient understands using medications safely. Needs Instruction    Patient understands monitoring blood glucose, interpreting and using results Needs Instruction    Patient understands prevention, detection, and treatment of acute complications. Needs Instruction    Patient understands prevention, detection, and treatment of chronic complications. Needs Instruction    Patient understands how to develop strategies to address psychosocial issues. Needs Instruction    Patient understands how to develop strategies to promote health/change behavior. Needs Instruction      Complications   Last HgB A1C per patient/outside source 6.4 %   10/28/2022   How often do you check your blood sugar? 3-4 times / week    Fasting Blood glucose range (mg/dL) 68-372    Have you had a dilated eye exam  in the past 12 months? No    Have you had a dental exam in the past 12 months? Yes    Are you checking your feet? Yes    How many days per week are you checking your feet? 7      Dietary Intake   Breakfast Coffee, part of a breakfast sandwich    Snack (morning) None    Lunch [employer] (chicken, vegetables, dinner roll, small piece of custard pie, coconut pie), fruit punch    Snack (afternoon) None    Dinner None    Snack (evening) None    Beverage(s)  Coffee, fruit punch      Activity / Exercise   Activity / Exercise Type ADL's    How many days per week do you exercise? 0    How many minutes per day do you exercise? 0    Total minutes per week of exercise 0      Patient Education   Previous Diabetes Education No    Disease Pathophysiology Factors that contribute to the development of diabetes;Explored patient's options for treatment of their diabetes    Healthy Eating Role of diet in the treatment of diabetes and the relationship between the three main macronutrients and blood glucose level;Plate Method    Being Active Helped patient identify appropriate exercises in relation to his/her diabetes, diabetes complications and other health issue.    Medications Reviewed patients medication for diabetes, action, purpose, timing of dose and side effects.    Monitoring Identified appropriate SMBG and/or A1C goals.    Chronic complications Relationship between chronic complications and blood glucose control;Lipid levels, blood glucose control and heart disease    Lifestyle and Health Coping Helped patient develop diabetes management plan for (enter comment)      Individualized Goals (developed by patient)   Nutrition Follow meal plan discussed    Physical Activity 15 minutes per day   Get up and move   Medications take my medication as prescribed    Monitoring  Test my blood glucose as discussed    Problem Solving Eating Pattern      Post-Education Assessment   Patient understands the diabetes disease and treatment process. Needs Review    Patient understands incorporating nutritional management into lifestyle. Needs Review    Patient undertands incorporating physical activity into lifestyle. Needs Review    Patient understands using medications safely. Needs Review    Patient understands monitoring blood glucose, interpreting and using results Needs Review    Patient understands prevention, detection, and treatment of acute complications.  Needs Review    Patient understands prevention, detection, and treatment of chronic complications. Needs Review    Patient understands how to develop strategies to address psychosocial issues. Needs Review    Patient understands how to develop strategies to promote health/change behavior. Needs Review      Outcomes   Expected Outcomes Demonstrated interest in learning. Expect positive outcomes    Future DMSE 4-6 wks    Program Status Not Completed             Individualized Plan for Diabetes Self-Management Training:   Learning Objective:  Patient will have a greater understanding of diabetes self-management. Patient education plan is to attend individual and/or group sessions per assessed needs and concerns.   Plan:   Patient Instructions  Go to [email or web address] and select "Forgot Username" to enter your personal information to gain access to your account.   Work towards eating three meals a day, about  5-6 hours apart!  Begin to recognize carbohydrates, proteins, and non-starchy vegetables in your food choices!  Begin to build your meals using the proportions of the Balanced Plate. First, select your carb choice(s) for the meal. Make this 25% of your meal. Next, select your source of protein to pair with your carb choice(s). Make this another 25% of your meal. Finally, complete your meal with a variety of non-starchy vegetables. Make this the remaining 50% of your meal.  Set an alarm clock for 3:00 in the afternoon and place it in another part of the house so that you have to get and go walk to turn it off.  Consider a daily chewable calcium supplement. Go to the pharmacy at [facility] and ask them to help you select a calcium supplement for you!     Expected Outcomes:  Demonstrated interest in learning. Expect positive outcomes  Education material provided: My Plate, Food lists  If problems or questions, patient to contact team via:  Phone and Email  Future DSME  appointment: 4-6 wks

## 2022-12-26 NOTE — Patient Instructions (Addendum)
Go to [email or web address] and select "Forgot Username" to enter your personal information to gain access to your account.   Work towards eating three meals a day, about 5-6 hours apart!  Begin to recognize carbohydrates, proteins, and non-starchy vegetables in your food choices!  Begin to build your meals using the proportions of the Balanced Plate. First, select your carb choice(s) for the meal. Make this 25% of your meal. Next, select your source of protein to pair with your carb choice(s). Make this another 25% of your meal. Finally, complete your meal with a variety of non-starchy vegetables. Make this the remaining 50% of your meal.  Set an alarm clock for 3:00 in the afternoon and place it in another part of the house so that you have to get and go walk to turn it off.  Consider a daily chewable calcium supplement. Go to the pharmacy at [facility] and ask them to help you select a calcium supplement for you!

## 2023-01-31 ENCOUNTER — Ambulatory Visit: Payer: Medicare PPO | Admitting: Dietician

## 2023-02-03 ENCOUNTER — Emergency Department: Payer: Medicare PPO

## 2023-02-03 ENCOUNTER — Other Ambulatory Visit: Payer: Self-pay

## 2023-02-03 ENCOUNTER — Emergency Department
Admission: EM | Admit: 2023-02-03 | Discharge: 2023-02-03 | Disposition: A | Discharge status: Home / Self Care | Payer: Medicare PPO | Attending: Emergency Medicine | Admitting: Emergency Medicine

## 2023-02-03 DIAGNOSIS — E119 Type 2 diabetes mellitus without complications: Secondary | ICD-10-CM | POA: Insufficient documentation

## 2023-02-03 DIAGNOSIS — R531 Weakness: Secondary | ICD-10-CM | POA: Insufficient documentation

## 2023-02-03 DIAGNOSIS — Z8673 Personal history of transient ischemic attack (TIA), and cerebral infarction without residual deficits: Secondary | ICD-10-CM | POA: Diagnosis not present

## 2023-02-03 LAB — BASIC METABOLIC PANEL
Anion gap: 13 (ref 5–15)
BUN: 14 mg/dL (ref 8–23)
CO2: 25 mmol/L (ref 22–32)
Calcium: 10.1 mg/dL (ref 8.9–10.3)
Chloride: 99 mmol/L (ref 98–111)
Creatinine, Ser: 0.8 mg/dL (ref 0.44–1.00)
GFR, Estimated: >60 mL/min (ref >60)
Glucose, Bld: 111 mg/dL — ABNORMAL HIGH (ref 70–99)
Potassium: 4.3 mmol/L (ref 3.5–5.1)
Sodium: 137 mmol/L (ref 135–145)

## 2023-02-03 LAB — URINALYSIS, ROUTINE W REFLEX MICROSCOPIC
Bilirubin Urine: NEGATIVE
Glucose, UA: NEGATIVE mg/dL
Hgb urine dipstick: NEGATIVE
Ketones, ur: NEGATIVE mg/dL
Leukocytes,Ua: NEGATIVE
Nitrite: NEGATIVE
Protein, ur: NEGATIVE mg/dL
Specific Gravity, Urine: 1.014 (ref 1.005–1.030)
pH: 5 (ref 5.0–8.0)

## 2023-02-03 LAB — CBC
HCT: 38.2 % (ref 36.0–46.0)
Hemoglobin: 12.4 g/dL (ref 12.0–15.0)
MCH: 28.1 pg (ref 26.0–34.0)
MCHC: 32.5 g/dL (ref 30.0–36.0)
MCV: 86.4 fL (ref 80.0–100.0)
Platelets: 349 10*3/uL (ref 150–400)
RBC: 4.42 MIL/uL (ref 3.87–5.11)
RDW: 15.2 % (ref 11.5–15.5)
WBC: 7.8 10*3/uL (ref 4.0–10.5)
nRBC: 0 % (ref 0.0–0.2)

## 2023-02-03 NOTE — ED Triage Notes (Signed)
Pt to ED from [facility] for eval for possible TIA on Wednesday. Pt denies all sx at this time. Husband states she acted like she was having a TIA and was just "quiet". Pt alert and oriented, ambulatory to triage, steady gait

## 2023-02-03 NOTE — Discharge Instructions (Signed)
Your CT and MRI today were negative.  Follow-up with your regular doctor.  Return to the ER for any new or worsening symptoms that concern you such as recurrent or worsening weakness, numbness, difficulty speaking, confusion or change in mental status, or any other new or worsening symptoms that concern you.

## 2023-02-03 NOTE — ED Provider Notes (Signed)
[facility] Provider Note    Event Date/Time   First MD Initiated Contact with Patient 02/03/23 1156     (approximate)   History   Stroke Symptoms   HPI  [place] is a 89 y.o. female with a history of diabetes, hyperlipidemia, and stroke who presents with an episode 2 nights ago of not feeling well.  The patient states that she is concerned this may be a TIA and was told to come to the ED for further evaluation.  She states that she has had 2 prior episodes like this where she suddenly would feel unwell and have difficulty describing what was going on.  However, it seems that during the prior episode she also had difficulty speaking.  She states that she was worked up for a TIA but there were no findings.  This time, her husband was with her and states that she did not have any difficulty speaking.  The episode lasted about 1 to 2 hours and the patient just vaguely felt unwell but did not have any focal symptoms.  The onset was sudden and the feeling also resolved spontaneously.  She denies any weakness or numbness, difficulty speaking, vision changes, or any other acute symptoms currently.  I reviewed the past medical records.  The patient's most recent outpatient counter prior to today was with internal medicine on 9/9 for follow-up of her chronic conditions.  She was seen by Dr. Chalmers Guest from the walk-in clinic today and referred to ED for further evaluation.   Physical Exam   Triage Vital Signs: ED Triage Vitals  Encounter Vitals Group     BP 02/03/23 1149 134/69     Systolic BP Percentile --      Diastolic BP Percentile --      Pulse Rate 02/03/23 1149 60     Resp 02/03/23 1149 16     Temp 02/03/23 1149 (!) 97.5 F (36.4 C)     Temp src --      SpO2 02/03/23 1149 97 %     Weight 02/03/23 1152 125 lb (56.7 kg)     Height 02/03/23 1152 5\' 1"  (1.549 m)     Head Circumference --      Peak Flow --      Pain Score 02/03/23 1152 0     Pain Loc --       Pain Education --      Exclude from Growth Chart --     Most recent vital signs: Vitals:   02/03/23 1149 02/03/23 1612  BP: 134/69 (!) 133/57  Pulse: 60 64  Resp: 16 18  Temp: (!) 97.5 F (36.4 C) 97.7 F (36.5 C)  SpO2: 97% 97%     General: Alert and oriented, no distress.  CV:  Good peripheral perfusion.  Resp:  Normal effort.  Abd:  No distention.  Other:  EOMI.  PERRLA.  No photophobia.  No facial droop.  Normal speech.  5/5 motor strength and intact sensation all extremities.  No pronator drift.  No ataxia on finger-to-nose.   ED Results / Procedures / Treatments   Labs (all labs ordered are listed, but only abnormal results are displayed) Labs Reviewed  BASIC METABOLIC PANEL - Abnormal; Notable for the following components:      Result Value   Glucose, Bld 111 (*)    All other components within normal limits  URINALYSIS, ROUTINE W REFLEX MICROSCOPIC - Abnormal; Notable for the following components:   Color,  Urine YELLOW (*)    APPearance HAZY (*)    All other components within normal limits  CBC     EKG     RADIOLOGY  CT head: I independently viewed and interpreted the images; there is no ICH  MR brain:   IMPRESSION:  1. No acute intracranial process.  2. Stable background of mild-to-moderate chronic small-vessel  disease.    PROCEDURES:  Critical Care performed: No  Procedures   MEDICATIONS ORDERED IN ED: Medications - No data to display   IMPRESSION / MDM / ASSESSMENT AND PLAN / ED COURSE  I reviewed the triage vital signs and the nursing notes.  89 year old female with PMH as noted above presents with acute onset of an episode 2 nights ago in which she vaguely did not feel well.  This started and resolved suddenly, and lasted about 2 hours.  She had no focal symptoms during this episode but has had similar episodes which were associated with aphasia and were diagnosed as TIA.  Currently physical exam including thorough neurologic  exam is normal.  Differential diagnosis includes, but is not limited to, near-syncope, vasovagal episode, dehydration, electrolyte abnormality, UTI or other infection.  I have a lower suspicion for TIA/CVA given the lack of any focal neurologic symptoms during the episode and the normal neuroexam currently.  We will obtain CT, labs, and reassess.  Patient's presentation is most consistent with acute complicated illness / injury requiring diagnostic workup.  ----------------------------------------- 4:14 PM on 02/03/2023 -----------------------------------------  CT is negative.  Lab workup is unremarkable.  Urinalysis shows no acute findings.  There is no leukocytosis, anemia, or any electrolyte abnormality.  MRI brain was obtained and is also negative for acute findings.  The patient remains asymptomatic.  I have a low suspicion for TIA with this presentation.  The patient is stable for discharge home.  I counseled her on the results of the workup and plan of care.  I gave strict return precautions and she expressed understanding.   FINAL CLINICAL IMPRESSION(S) / ED DIAGNOSES   Final diagnoses:  Weakness     Rx / DC Orders   ED Discharge Orders     None        Note:  This document was prepared using Dragon voice recognition software and may include unintentional dictation errors.    Dionne Bucy, MD 02/03/23 [phone]

## 2023-05-24 DIAGNOSIS — Z961 Presence of intraocular lens: Secondary | ICD-10-CM | POA: Diagnosis not present

## 2023-05-24 DIAGNOSIS — E119 Type 2 diabetes mellitus without complications: Secondary | ICD-10-CM | POA: Diagnosis not present

## 2023-05-24 DIAGNOSIS — Z0100 Encounter for examination of eyes and vision without abnormal findings: Secondary | ICD-10-CM | POA: Diagnosis not present

## 2023-07-17 DIAGNOSIS — E119 Type 2 diabetes mellitus without complications: Secondary | ICD-10-CM | POA: Diagnosis not present

## 2023-07-24 DIAGNOSIS — E785 Hyperlipidemia, unspecified: Secondary | ICD-10-CM | POA: Diagnosis not present

## 2023-07-24 DIAGNOSIS — I10 Essential (primary) hypertension: Secondary | ICD-10-CM | POA: Diagnosis not present

## 2023-07-24 DIAGNOSIS — E119 Type 2 diabetes mellitus without complications: Secondary | ICD-10-CM | POA: Diagnosis not present

## 2023-08-25 DIAGNOSIS — R413 Other amnesia: Secondary | ICD-10-CM | POA: Diagnosis not present

## 2023-08-25 DIAGNOSIS — Z8673 Personal history of transient ischemic attack (TIA), and cerebral infarction without residual deficits: Secondary | ICD-10-CM | POA: Diagnosis not present

## 2023-09-21 DIAGNOSIS — M7989 Other specified soft tissue disorders: Secondary | ICD-10-CM | POA: Diagnosis not present

## 2023-11-15 DIAGNOSIS — E119 Type 2 diabetes mellitus without complications: Secondary | ICD-10-CM | POA: Diagnosis not present

## 2023-11-15 DIAGNOSIS — R413 Other amnesia: Secondary | ICD-10-CM | POA: Diagnosis not present

## 2023-11-15 DIAGNOSIS — I10 Essential (primary) hypertension: Secondary | ICD-10-CM | POA: Diagnosis not present

## 2023-11-15 DIAGNOSIS — E785 Hyperlipidemia, unspecified: Secondary | ICD-10-CM | POA: Diagnosis not present

## 2024-02-01 DIAGNOSIS — R413 Other amnesia: Secondary | ICD-10-CM | POA: Diagnosis not present

## 2024-02-15 DIAGNOSIS — I10 Essential (primary) hypertension: Secondary | ICD-10-CM | POA: Diagnosis not present

## 2024-02-15 DIAGNOSIS — G8929 Other chronic pain: Secondary | ICD-10-CM | POA: Diagnosis not present

## 2024-02-15 DIAGNOSIS — Z7984 Long term (current) use of oral hypoglycemic drugs: Secondary | ICD-10-CM | POA: Diagnosis not present

## 2024-02-15 DIAGNOSIS — E119 Type 2 diabetes mellitus without complications: Secondary | ICD-10-CM | POA: Diagnosis not present

## 2024-02-15 DIAGNOSIS — R413 Other amnesia: Secondary | ICD-10-CM | POA: Diagnosis not present

## 2024-02-15 DIAGNOSIS — E785 Hyperlipidemia, unspecified: Secondary | ICD-10-CM | POA: Diagnosis not present

## 2024-02-15 DIAGNOSIS — M25511 Pain in right shoulder: Secondary | ICD-10-CM | POA: Diagnosis not present

## 2024-02-15 DIAGNOSIS — Z23 Encounter for immunization: Secondary | ICD-10-CM | POA: Diagnosis not present

## 2024-02-15 DIAGNOSIS — Z0000 Encounter for general adult medical examination without abnormal findings: Secondary | ICD-10-CM | POA: Diagnosis not present
